# Patient Record
Sex: Male | Born: 1955 | Race: White | Hispanic: No | Marital: Married | Smoking: Current every day smoker
Health system: Southern US, Community
[De-identification: ages and names within clinical notes are randomized; demographics above are authoritative.]

## PROBLEM LIST (undated history)

## (undated) DIAGNOSIS — M549 Dorsalgia, unspecified: Secondary | ICD-10-CM

## (undated) DIAGNOSIS — M1990 Unspecified osteoarthritis, unspecified site: Secondary | ICD-10-CM

## (undated) DIAGNOSIS — J449 Chronic obstructive pulmonary disease, unspecified: Secondary | ICD-10-CM

---

## 2014-07-25 ENCOUNTER — Other Ambulatory Visit (HOSPITAL_COMMUNITY): Payer: Self-pay | Admitting: Family Medicine

## 2014-07-25 ENCOUNTER — Ambulatory Visit (HOSPITAL_COMMUNITY)
Admission: RE | Admit: 2014-07-25 | Discharge: 2014-07-25 | Disposition: A | Discharge status: Home / Self Care | Payer: BLUE CROSS/BLUE SHIELD | Source: Ambulatory Visit | Attending: Family Medicine | Admitting: Family Medicine

## 2014-07-25 DIAGNOSIS — J449 Chronic obstructive pulmonary disease, unspecified: Secondary | ICD-10-CM | POA: Insufficient documentation

## 2014-07-25 DIAGNOSIS — J439 Emphysema, unspecified: Secondary | ICD-10-CM

## 2014-07-25 DIAGNOSIS — R0602 Shortness of breath: Secondary | ICD-10-CM | POA: Insufficient documentation

## 2014-10-02 ENCOUNTER — Encounter (HOSPITAL_COMMUNITY): Payer: Self-pay

## 2014-10-02 ENCOUNTER — Emergency Department (HOSPITAL_COMMUNITY): Payer: BLUE CROSS/BLUE SHIELD

## 2014-10-02 ENCOUNTER — Inpatient Hospital Stay (HOSPITAL_COMMUNITY)
Admission: EM | Admit: 2014-10-02 | Discharge: 2014-10-04 | DRG: 190 | Disposition: A | Discharge status: Home / Self Care | Payer: BLUE CROSS/BLUE SHIELD | Attending: Internal Medicine | Admitting: Internal Medicine

## 2014-10-02 DIAGNOSIS — F419 Anxiety disorder, unspecified: Secondary | ICD-10-CM | POA: Diagnosis present

## 2014-10-02 DIAGNOSIS — Z87891 Personal history of nicotine dependence: Secondary | ICD-10-CM | POA: Diagnosis not present

## 2014-10-02 DIAGNOSIS — E871 Hypo-osmolality and hyponatremia: Secondary | ICD-10-CM | POA: Diagnosis present

## 2014-10-02 DIAGNOSIS — M1990 Unspecified osteoarthritis, unspecified site: Secondary | ICD-10-CM | POA: Diagnosis present

## 2014-10-02 DIAGNOSIS — R739 Hyperglycemia, unspecified: Secondary | ICD-10-CM | POA: Diagnosis present

## 2014-10-02 DIAGNOSIS — J9601 Acute respiratory failure with hypoxia: Secondary | ICD-10-CM | POA: Diagnosis not present

## 2014-10-02 DIAGNOSIS — J9600 Acute respiratory failure, unspecified whether with hypoxia or hypercapnia: Secondary | ICD-10-CM | POA: Diagnosis not present

## 2014-10-02 DIAGNOSIS — T380X5A Adverse effect of glucocorticoids and synthetic analogues, initial encounter: Secondary | ICD-10-CM | POA: Diagnosis present

## 2014-10-02 DIAGNOSIS — R0602 Shortness of breath: Secondary | ICD-10-CM | POA: Diagnosis present

## 2014-10-02 DIAGNOSIS — D696 Thrombocytopenia, unspecified: Secondary | ICD-10-CM | POA: Diagnosis present

## 2014-10-02 DIAGNOSIS — J441 Chronic obstructive pulmonary disease with (acute) exacerbation: Principal | ICD-10-CM | POA: Diagnosis present

## 2014-10-02 DIAGNOSIS — J9622 Acute and chronic respiratory failure with hypercapnia: Secondary | ICD-10-CM | POA: Insufficient documentation

## 2014-10-02 DIAGNOSIS — R000 Tachycardia, unspecified: Secondary | ICD-10-CM | POA: Diagnosis present

## 2014-10-02 HISTORY — DX: Dorsalgia, unspecified: M54.9

## 2014-10-02 HISTORY — DX: Unspecified osteoarthritis, unspecified site: M19.90

## 2014-10-02 HISTORY — DX: Chronic obstructive pulmonary disease, unspecified: J44.9

## 2014-10-02 LAB — BLOOD GAS, ARTERIAL
ACID-BASE EXCESS: 0 mmol/L (ref 0.0–2.0)
Bicarbonate: 25 mEq/L — ABNORMAL HIGH (ref 20.0–24.0)
DRAWN BY: 27407
Delivery systems: POSITIVE
EXPIRATORY PAP: 6
FIO2: 0.4
INSPIRATORY PAP: 14
O2 Saturation: 99.5 %
PO2 ART: 206 mmHg — AB (ref 80.0–100.0)
TCO2: 19.8 mmol/L (ref 0–100)
pCO2 arterial: 33.4 mmHg — ABNORMAL LOW (ref 35.0–45.0)
pH, Arterial: 7.46 — ABNORMAL HIGH (ref 7.350–7.450)

## 2014-10-02 LAB — COMPREHENSIVE METABOLIC PANEL
ALBUMIN: 4.7 g/dL (ref 3.5–5.0)
ALK PHOS: 98 U/L (ref 38–126)
ALT: 25 U/L (ref 17–63)
ANION GAP: 10 (ref 5–15)
AST: 36 U/L (ref 15–41)
BUN: 25 mg/dL — ABNORMAL HIGH (ref 6–20)
CALCIUM: 9.3 mg/dL (ref 8.9–10.3)
CHLORIDE: 94 mmol/L — AB (ref 101–111)
CO2: 29 mmol/L (ref 22–32)
CREATININE: 0.87 mg/dL (ref 0.61–1.24)
GFR calc non Af Amer: >60 mL/min (ref >60)
GLUCOSE: 122 mg/dL — AB (ref 65–99)
Potassium: 4.2 mmol/L (ref 3.5–5.1)
SODIUM: 133 mmol/L — AB (ref 135–145)
Total Bilirubin: 1.7 mg/dL — ABNORMAL HIGH (ref 0.3–1.2)
Total Protein: 8.8 g/dL — ABNORMAL HIGH (ref 6.5–8.1)

## 2014-10-02 LAB — URINALYSIS, ROUTINE W REFLEX MICROSCOPIC
GLUCOSE, UA: NEGATIVE mg/dL
Leukocytes, UA: NEGATIVE
Nitrite: NEGATIVE
PROTEIN: 30 mg/dL — AB
Specific Gravity, Urine: >1.03 — ABNORMAL HIGH (ref 1.005–1.030)
UROBILINOGEN UA: 1 mg/dL (ref 0.0–1.0)
pH: 5.5 (ref 5.0–8.0)

## 2014-10-02 LAB — CBC WITH DIFFERENTIAL/PLATELET
BASOS PCT: 0 % (ref 0–1)
Basophils Absolute: 0 10*3/uL (ref 0.0–0.1)
Eosinophils Absolute: 0 10*3/uL (ref 0.0–0.7)
Eosinophils Relative: 0 % (ref 0–5)
HEMATOCRIT: 45.7 % (ref 39.0–52.0)
HEMOGLOBIN: 16.3 g/dL (ref 13.0–17.0)
LYMPHS ABS: 0.8 10*3/uL (ref 0.7–4.0)
Lymphocytes Relative: 14 % (ref 12–46)
MCH: 31.1 pg (ref 26.0–34.0)
MCHC: 35.7 g/dL (ref 30.0–36.0)
MCV: 87.2 fL (ref 78.0–100.0)
MONO ABS: 0.6 10*3/uL (ref 0.1–1.0)
MONOS PCT: 10 % (ref 3–12)
NEUTROS ABS: 4.4 10*3/uL (ref 1.7–7.7)
NEUTROS PCT: 76 % (ref 43–77)
Platelets: 139 10*3/uL — ABNORMAL LOW (ref 150–400)
RBC: 5.24 MIL/uL (ref 4.22–5.81)
RDW: 14 % (ref 11.5–15.5)
WBC: 5.9 10*3/uL (ref 4.0–10.5)

## 2014-10-02 LAB — I-STAT CG4 LACTIC ACID, ED: LACTIC ACID, VENOUS: 1.48 mmol/L (ref 0.5–2.0)

## 2014-10-02 LAB — BRAIN NATRIURETIC PEPTIDE: B NATRIURETIC PEPTIDE 5: 58 pg/mL (ref 0.0–100.0)

## 2014-10-02 LAB — URINE MICROSCOPIC-ADD ON

## 2014-10-02 LAB — TROPONIN I: Troponin I: 0.03 ng/mL (ref <0.031)

## 2014-10-02 LAB — TSH: TSH: 1.051 u[IU]/mL (ref 0.350–4.500)

## 2014-10-02 LAB — MRSA PCR SCREENING: MRSA by PCR: NEGATIVE

## 2014-10-02 MED ORDER — METHYLPREDNISOLONE SODIUM SUCC 125 MG IJ SOLR
125.0000 mg | Freq: Once | INTRAMUSCULAR | Status: AC
  Administered 2014-10-02: 125 mg via INTRAVENOUS
  Filled 2014-10-02: qty 2

## 2014-10-02 MED ORDER — ONDANSETRON HCL 4 MG PO TABS: 4.0000 mg | ORAL_TABLET | Freq: Four times a day (QID) | ORAL | Status: DC | PRN

## 2014-10-02 MED ORDER — SENNOSIDES-DOCUSATE SODIUM 8.6-50 MG PO TABS
1.0000 | ORAL_TABLET | Freq: Every evening | ORAL | Status: DC | PRN
  Administered 2014-10-03: 1 via ORAL
  Filled 2014-10-02: qty 1

## 2014-10-02 MED ORDER — HYDROCODONE-ACETAMINOPHEN 5-325 MG PO TABS: 1.0000 | ORAL_TABLET | ORAL | Status: DC | PRN

## 2014-10-02 MED ORDER — LEVOFLOXACIN IN D5W 750 MG/150ML IV SOLN: 750.0000 mg | INTRAVENOUS | Status: DC

## 2014-10-02 MED ORDER — SODIUM CHLORIDE 0.9 % IJ SOLN
3.0000 mL | Freq: Two times a day (BID) | INTRAMUSCULAR | Status: DC
  Administered 2014-10-02 – 2014-10-04 (×5): 3 mL via INTRAVENOUS

## 2014-10-02 MED ORDER — IPRATROPIUM-ALBUTEROL 0.5-2.5 (3) MG/3ML IN SOLN
3.0000 mL | Freq: Once | RESPIRATORY_TRACT | Status: AC
  Administered 2014-10-02: 3 mL via RESPIRATORY_TRACT
  Filled 2014-10-02: qty 3

## 2014-10-02 MED ORDER — ACETAMINOPHEN 325 MG PO TABS: 650.0000 mg | ORAL_TABLET | Freq: Four times a day (QID) | ORAL | Status: DC | PRN

## 2014-10-02 MED ORDER — MAGNESIUM SULFATE IN D5W 10-5 MG/ML-% IV SOLN
1.0000 g | Freq: Once | INTRAVENOUS | Status: AC
  Administered 2014-10-02: 1 g via INTRAVENOUS
  Filled 2014-10-02: qty 100

## 2014-10-02 MED ORDER — ALUM & MAG HYDROXIDE-SIMETH 200-200-20 MG/5ML PO SUSP
30.0000 mL | Freq: Four times a day (QID) | ORAL | Status: DC | PRN
Start: 2014-10-02 — End: 2014-10-04

## 2014-10-02 MED ORDER — ENOXAPARIN SODIUM 30 MG/0.3ML ~~LOC~~ SOLN
30.0000 mg | SUBCUTANEOUS | Status: DC
  Administered 2014-10-02 – 2014-10-03 (×2): 30 mg via SUBCUTANEOUS
  Filled 2014-10-02 (×2): qty 0.3

## 2014-10-02 MED ORDER — LEVOFLOXACIN IN D5W 750 MG/150ML IV SOLN
750.0000 mg | INTRAVENOUS | Status: DC
  Filled 2014-10-02: qty 150

## 2014-10-02 MED ORDER — SORBITOL 70 % SOLN: 30.0000 mL | Freq: Every day | Status: DC | PRN

## 2014-10-02 MED ORDER — LEVOFLOXACIN IN D5W 500 MG/100ML IV SOLN
500.0000 mg | Freq: Once | INTRAVENOUS | Status: AC
  Administered 2014-10-02: 500 mg via INTRAVENOUS
  Filled 2014-10-02: qty 100

## 2014-10-02 MED ORDER — SODIUM CHLORIDE 0.9 % IV SOLN
INTRAVENOUS | Status: AC
  Administered 2014-10-02: 12:00:00 via INTRAVENOUS

## 2014-10-02 MED ORDER — ACETAMINOPHEN 650 MG RE SUPP
650.0000 mg | Freq: Four times a day (QID) | RECTAL | Status: DC | PRN
Start: 2014-10-02 — End: 2014-10-04

## 2014-10-02 MED ORDER — GUAIFENESIN-DM 100-10 MG/5ML PO SYRP: 5.0000 mL | ORAL_SOLUTION | ORAL | Status: DC | PRN

## 2014-10-02 MED ORDER — TIOTROPIUM BROMIDE MONOHYDRATE 18 MCG IN CAPS: 18.0000 ug | ORAL_CAPSULE | Freq: Every day | RESPIRATORY_TRACT | Status: DC

## 2014-10-02 MED ORDER — ONDANSETRON HCL 4 MG/2ML IJ SOLN: 4.0000 mg | Freq: Four times a day (QID) | INTRAMUSCULAR | Status: DC | PRN

## 2014-10-02 MED ORDER — METHYLPREDNISOLONE SODIUM SUCC 125 MG IJ SOLR
60.0000 mg | Freq: Four times a day (QID) | INTRAMUSCULAR | Status: DC
  Administered 2014-10-02 – 2014-10-04 (×8): 60 mg via INTRAVENOUS
  Filled 2014-10-02 (×9): qty 2

## 2014-10-02 MED ORDER — IPRATROPIUM-ALBUTEROL 0.5-2.5 (3) MG/3ML IN SOLN
3.0000 mL | RESPIRATORY_TRACT | Status: DC
  Administered 2014-10-02 – 2014-10-03 (×9): 3 mL via RESPIRATORY_TRACT
  Filled 2014-10-02 (×9): qty 3

## 2014-10-02 NOTE — ED Provider Notes (Signed)
MSE was initiated and I personally evaluated the patient and placed orders (if any) at  7:22 AM on October 02, 2014.  The patient appears stable so that the remainder of the MSE may be completed by another provider.  Patient arrived in respiratory distress. History of COPD. Reports 2 days of increasing shortness of breath, low-grade temperatures around 100, cough productive of yellow sputum. Has been using his inhalers at home with no relief. States he has never felt this bad. On EMS arrival had O2 sats in the low 90s. Received a DuoNeb en route. Denies any chest pain. No history of heart failure.  On exam, patient is tripoding and using accessory muscles. Poor air movement. Given presentation, will immediately start patient on BiPAP.  Sepsis workup initiated given history of fevers and cough. Will hold antibiotics and fluids until initial vital signs obtained. Patient given Solu-Medrol and magnesium as well as a duo neb. Discussed patient presentation of oncoming provider, Dr. Patria Mane.  Shon Baton, MD 10/02/14 [phone]

## 2014-10-02 NOTE — Progress Notes (Signed)
Pt transported from ER to ICU on Bipap.  Pt tolerating well at this time.  RT will continue to monitor.

## 2014-10-02 NOTE — ED Notes (Signed)
Pt not able to void at this time, urinal at bedside.

## 2014-10-02 NOTE — ED Notes (Signed)
Note sent to Pharmacy for Magnesium.

## 2014-10-02 NOTE — ED Notes (Signed)
EMS reports pt has copd and has had sob x 2 days.  EMS administered albuterol treatment and is receiving combivent at this time.  Also reports fever and productive cough will yellow sputum.

## 2014-10-02 NOTE — Progress Notes (Signed)
Pt setup on Bipap per MD order.  Tolerating well at this time.  RT will continue to monitor.

## 2014-10-02 NOTE — H&P (Signed)
[facility] History and Physical  Roger Arnold ZOX:[id number] DOB: January 04, 1956 DOA: 10/02/2014  Referring physician: Patria Mane PCP: Pershing Proud   Chief Complaint: sob  HPI: Roger Arnold is a 59 y.o. male past medical hx copd not on oxygen and recently put on inhaler by PCP presents to ED with persistent worsening sob. Initial evaluation in the emergency department revealed acute respiratory failure and patient was placed on BiPAP. Information is obtained from the wife who is at the bedside. She reports gradual worsening shortness of breath over the last several weeks. He was seen by primary care provider 2 months ago and placed on long-term and rescue inhalers. She states "I feel he uses the rescue inhaler way too much". 2 days ago his respiratory effort began to gradually worsen. Associated symptoms include nonproductive cough and fever 2 episodes of diarrhea. He denies chest pain palpitations headache dizziness syncope or near-syncope. He denies lower extremity edema orthopnea. He denies abdominal pain nausea vomiting dysuria hematuria frequency or urgency. Wife reports over the last 2 days he's been using his inhalers but his respiratory status continued to worsen. During this time. Patient refused to go to the hospital. This morning the wife finally insisted.  Workup in the emergency department reveals sodium 133, platelets 139, glucose 122. Chest x-ray no active cardiopulmonary disease. Changes of COPD stable. EKG ectopic atrial tachycardia, unifocal ventricular premature complex  While in the emergency department he is provided with Solu-Medrol and Levaquin broncho-dilator 2. Respiratory rate 30 and he was placed on BiPAP. He gradually improved and was referred for admission.  Review of Systems:  10 point review of systems complete and all systems are negative except as indicated in the history of present illness  Past Medical History  Diagnosis Date  . COPD (chronic  obstructive pulmonary disease)   . Arthritis   . Back pain    History reviewed. No pertinent past surgical history. Social History:  reports that he has quit smoking. He does not have any smokeless tobacco history on file. He reports that he does not drink alcohol or use illicit drugs. 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 He quit smoking 6 years ago. He lives at home with his wife he is independent with ADLs. Allergies  Allergen Reactions  . Percocet [Oxycodone-Acetaminophen] Rash and Other (See Comments)    Has cold sweats    No family history on file. father deceased of brain tumor mother is alive history of heart disease cancer stroke. Recently diagnosed with COPD  Prior to Admission medications   Medication Sig Start Date End Date Taking? Authorizing Provider  PROAIR HFA 108 (90 BASE) MCG/ACT inhaler Inhale 2 puffs into the lungs every 6 (six) hours as needed. Shortness of breath,cough(rescue inhaler) 08/31/14   Historical Provider, MD  SPIRIVA RESPIMAT 2.5 MCG/ACT AERS Inhale 2 puffs into the lungs daily. 09/01/14   Historical Provider, MD   Physical Exam: Filed Vitals:   10/02/14 0815 10/02/14 0838 10/02/14 0900 10/02/14 0906  BP:  132/78 123/79   Pulse: 113 108 103   Temp:      TempSrc:      Resp: 19 28 27    Height:      Weight:      SpO2: 99% 100% 100% 100%    Wt Readings from Last 3 Encounters:  10/02/14 68.04 kg (150 lb)    General:  Appears calm and somewhat comfortable Eyes: PERRL, normal lids, irises & conjunctiva ENT: grossly  normal hearing, lips & tongue Neck: no LAD, masses or thyromegaly Cardiovascular: Tachycardic but regular, no m/r/g. No LE edema.  Respiratory: Mild increased work of breathing on BiPAP with conversation. Breath sounds quite diminished throughout. Faint wheezing. Abdomen: soft, ntnd positive bowel sounds Skin:  Musculoskeletal: grossly normal tone BUE/BLE Psychiatric: grossly normal mood and affect,  speech fluent and appropriate Neurologic: grossly non-focal.          Labs on Admission:  Basic Metabolic Panel:  Recent Labs Lab 10/02/14 0722  NA 133*  K 4.2  CL 94*  CO2 29  GLUCOSE 122*  BUN 25*  CREATININE 0.87  CALCIUM 9.3   Liver Function Tests:  Recent Labs Lab 10/02/14 0722  AST 36  ALT 25  ALKPHOS 98  BILITOT 1.7*  PROT 8.8*  ALBUMIN 4.7   No results for input(s): LIPASE, AMYLASE in the last 168 hours. No results for input(s): AMMONIA in the last 168 hours. CBC:  Recent Labs Lab 10/02/14 0722  WBC 5.9  NEUTROABS 4.4  HGB 16.3  HCT 45.7  MCV 87.2  PLT 139*   Cardiac Enzymes:  Recent Labs Lab 10/02/14 0722  TROPONINI 0.03    BNP (last 3 results)  Recent Labs  10/02/14 0723  BNP 58.0    ProBNP (last 3 results) No results for input(s): PROBNP in the last 8760 hours.  CBG: No results for input(s): GLUCAP in the last 168 hours.  Radiological Exams on Admission: Dg Chest Port 1 View  10/02/2014   CLINICAL DATA:  Productive cough, fever, shortness of breath for 2 days.  EXAM: PORTABLE CHEST - 1 VIEW  COMPARISON:  July 25, 2014  FINDINGS: The heart size and mediastinal contours are stable. The aorta is tortuous. There is questioned dilatation of the ascending in the aorta versus tortuosity of the aorta unchanged. The lungs are hyperinflated. There is no focal infiltrate, pulmonary edema, or pleural effusion. The visualized skeletal structures are stable.  IMPRESSION: No active cardiopulmonary disease.  Changes of COPD stable.   Electronically Signed   By: Sherian Rein M.D.   On: 10/02/2014 08:11    EKG: Independently reviewed. See above  Assessment/Plan Principal Problem:   Acute respiratory failure: Likely related to COPD exacerbation. Trigger unclear. Will admit to step down and continue BiPAP. Will wean as able. Will continue nebulizers steroids and antibiotic. Chest x-ray without any signs of infiltrate and patient afebrile on  presentation. Currently oxygen saturation level 100% on BiPAP. He is much more comfortable. Monitor closely Active Problems:   COPD exacerbation: Trigger unclear. Patient seen by [place] PCP 2 months ago for gradual worsening shortness of breath. At that time he was given spiriva and pro air. Wife reports "needing the rescue inhaler way too much". Will likely benefit from pulmonary function test on outpatient basis. Not currently on outpatient oxygen. Will evaluate at discharge.    Tachycardia: Likely related to above. Admit to step down. No chest pain. Initial troponin negative. Will monitor    Hyponatremia: Mild. Likely related to decreased oral intake over the last couple of days. Will provide gentle IV fluids. Will recheck in the morning    Thrombocytopenia: Mild. Will monitor. No signs of bleeding.   Code Status: full DVT Prophylaxis: Family Communication: Wife and daughter at bedside Disposition Plan: home when ready  Time spent: 75 minutes  [facility] Pager [phone]

## 2014-10-02 NOTE — ED Notes (Addendum)
Feet mottled and left  Hand, wife says they have been mottled for greater than 6 months.  Had labored breathing since Friday.

## 2014-10-02 NOTE — ED Notes (Signed)
Resp. Called for transport.

## 2014-10-02 NOTE — Progress Notes (Signed)
ANTIBIOTIC CONSULT NOTE - INITIAL  Pharmacy Consult for Levaquin Indication: COPD exacerbation  Allergies  Allergen Reactions  . Percocet [Oxycodone-Acetaminophen] Rash and Other (See Comments)    Has cold sweats    Patient Measurements: Height:  (175.3 cm) Weight: 150 lb (68.04 kg) IBW/kg (Calculated) : 70.7  Vital Signs: Temp: 100 F (37.8 C) (09/12 0722) Temp Source: Oral (09/12 0722) BP: 126/74 mmHg (09/12 1000) Pulse Rate: 101 (09/12 1000) Intake/Output from previous day:   Intake/Output from this shift:    Labs:  Recent Labs  10/02/14 0722  WBC 5.9  HGB 16.3  PLT 139*  CREATININE 0.87   Estimated Creatinine Clearance: 85.8 mL/min (by C-G formula based on Cr of 0.87). No results for input(s): VANCOTROUGH, VANCOPEAK, VANCORANDOM, GENTTROUGH, GENTPEAK, GENTRANDOM, TOBRATROUGH, TOBRAPEAK, TOBRARND, AMIKACINPEAK, AMIKACINTROU, AMIKACIN in the last 72 hours.   Microbiology: Recent Results (from the past 720 hour(s))  Blood Culture (routine x 2)     Status: None (Preliminary result)   Collection Time: 10/02/14  7:45 AM  Result Value Ref Range Status   Specimen Description BLOOD  Final   Special Requests NONE  Final   Culture NO GROWTH < 12 HOURS  Final   Report Status PENDING  Incomplete  Blood Culture (routine x 2)     Status: None (Preliminary result)   Collection Time: 10/02/14  7:50 AM  Result Value Ref Range Status   Specimen Description BLOOD  Final   Special Requests NONE  Final   Culture NO GROWTH < 12 HOURS  Final   Report Status PENDING  Incomplete    Medical History: Past Medical History  Diagnosis Date  . COPD (chronic obstructive pulmonary disease)   . Arthritis   . Back pain    Anti-infectives    Start     Dose/Rate Route Frequency Ordered Stop   10/02/14 1200  levofloxacin (LEVAQUIN) IVPB 750 mg     750 mg 100 mL/hr over 90 Minutes Intravenous Every 24 hours 10/02/14 1147 10/09/14 1159   10/02/14 0900  levofloxacin (LEVAQUIN)  IVPB 500 mg     500 mg 100 mL/hr over 60 Minutes Intravenous  Once 10/02/14 0854 10/02/14 1028     Assessment: 59yo male with h/o COPD presents with persistent worsening SOB.  Asked to initiate Levaquin, pt received initial dose in ED.  Estimated Creatinine Clearance: 85.8 mL/min (by C-G formula based on Cr of 0.87).  Goal of Therapy:  Eradicate infection.  Plan:  Levaquin  IV q24hrs starting tomorrow Switch to PO when improved / appropriate Duration of therapy per MD, anticipate 5-7 days total Monitor labs, progress, and c/s  Margo Aye, [place] Zarazua A 10/02/2014,11:53 AM

## 2014-10-02 NOTE — ED Provider Notes (Signed)
CSN: [id number]     Arrival date & time 10/02/14  2956 History   First MD Initiated Contact with Patient 10/02/14 [id number]     Chief Complaint  Patient presents with  . Shortness of Breath      The history is provided by the patient and the EMS personnel.   patient presents to the emergency department with increasing shortness of breath over the past 2 days.  He has a history of COPD and is not oxygen dependent.  He's been trying his inhalers without improvement in his symptoms.  His wife is been begging him to come the emergency department for evaluation but he would not.  His breathing finally became worse this morning to the point where EMS was called.  Albuterol and Atrovent were given in route and the patient was placed on BiPAP on arrival to the emergency department for significant respiratory distress.  Prior to my evaluation the off going physician provided Solu-Medrol as well as magnesium.  On my arrival we are placing the patient on BiPAP.  Patient and family report fever through the weekend with productive cough.  Denies anterior chest pain.  No history of congestive heart failure.  Denies unilateral leg swelling.  No history DVT or pulmonary embolism.  Reports diarrhea without nausea vomiting  Past Medical History  Diagnosis Date  . COPD (chronic obstructive pulmonary disease)   . Arthritis   . Back pain    History reviewed. No pertinent past surgical history. No family history on file. Social History  Substance Use Topics  . Smoking status: Former Games developer  . Smokeless tobacco: None  . Alcohol Use: No    Review of Systems  All other systems reviewed and are negative.     Allergies  Percocet  Home Medications   Prior to Admission medications   Not on File   BP 132/78 mmHg  Pulse 108  Temp(Src) 100 F (37.8 C) (Oral)  Resp 28  Ht  (1.753 m)  Wt 150 lb (68.04 kg)  BMI 22.14 kg/m2  SpO2 100% Physical Exam  Constitutional: He is oriented to person, place, and  time. He appears well-developed and well-nourished.  HENT:  Head: Normocephalic and atraumatic.  Eyes: EOM are normal.  Neck: Normal range of motion.  Cardiovascular: Regular rhythm, normal heart sounds and intact distal pulses.   tachycardia  Pulmonary/Chest: He is in respiratory distress. He has wheezes.  Accessory muscle use  Abdominal: Soft. He exhibits no distension. There is no tenderness.  Musculoskeletal: Normal range of motion.  Neurological: He is alert and oriented to person, place, and time.  Skin: Skin is warm and dry.  Psychiatric:  Anxious appearing  Nursing note and vitals reviewed.   ED Course  Procedures (including critical care time)  CRITICAL CARE Performed by: Lyanne Co Total critical care time: 32 Critical care time was exclusive of separately billable procedures and treating other patients. Critical care was necessary to treat or prevent imminent or life-threatening deterioration. Critical care was time spent personally by me on the following activities: development of treatment plan with patient and/or surrogate as well as nursing, discussions with consultants, evaluation of patient's response to treatment, examination of patient, obtaining history from patient or surrogate, ordering and performing treatments and interventions, ordering and review of laboratory studies, ordering and review of radiographic studies, pulse oximetry and re-evaluation of patient's condition.   Labs Review Labs Reviewed  COMPREHENSIVE METABOLIC PANEL - Abnormal; Notable for the following:    Sodium 133 (*)  Chloride 94 (*)    Glucose, Bld 122 (*)    BUN 25 (*)    Total Protein 8.8 (*)    Total Bilirubin 1.7 (*)    All other components within normal limits  CBC WITH DIFFERENTIAL/PLATELET - Abnormal; Notable for the following:    Platelets 139 (*)    All other components within normal limits  URINALYSIS, ROUTINE W REFLEX MICROSCOPIC (NOT AT [facility]) - Abnormal; Notable  for the following:    Specific Gravity, Urine >1.030 (*)    Hgb urine dipstick LARGE (*)    Bilirubin Urine SMALL (*)    Ketones, ur TRACE (*)    Protein, ur 30 (*)    All other components within normal limits  BLOOD GAS, ARTERIAL - Abnormal; Notable for the following:    pH, Arterial 7.460 (*)    pCO2 arterial 33.4 (*)    pO2, Arterial 206 (*)    Bicarbonate 25.0 (*)    All other components within normal limits  URINE MICROSCOPIC-ADD ON - Abnormal; Notable for the following:    Bacteria, UA MANY (*)    All other components within normal limits  CULTURE, BLOOD (ROUTINE X 2)  CULTURE, BLOOD (ROUTINE X 2)  URINE CULTURE  BRAIN NATRIURETIC PEPTIDE  TROPONIN I  I-STAT CG4 LACTIC ACID, ED    Imaging Review Dg Chest Port 1 View  10/02/2014   CLINICAL DATA:  Productive cough, fever, shortness of breath for 2 days.  EXAM: PORTABLE CHEST - 1 VIEW  COMPARISON:  July 25, 2014  FINDINGS: The heart size and mediastinal contours are stable. The aorta is tortuous. There is questioned dilatation of the ascending in the aorta versus tortuosity of the aorta unchanged. The lungs are hyperinflated. There is no focal infiltrate, pulmonary edema, or pleural effusion. The visualized skeletal structures are stable.  IMPRESSION: No active cardiopulmonary disease.  Changes of COPD stable.   Electronically Signed   By: Sherian Rein M.D.   On: 10/02/2014 08:11   I have personally reviewed and evaluated these images and lab results as part of my medical decision-making.   EKG Interpretation None      MDM   Final diagnoses:  Acute respiratory failure, unspecified whether with hypoxia or hypercapnia  COPD exacerbation    Patient appears to be improving somewhat on maximal therapy.  At this time he is tolerating BiPAP well and I suspect that over the next several hours she'll BLD be titrated off BiPAP.  Will put additional bronchodilators on this time.  Add IV Levaquin for severe COPD exacerbation.  No  obvious infiltrate noted on chest x-ray.  Labs unremarkable.  Patient will be admitted to ICU stepdown.  Will speak with hospitalist.    Azalia Bilis, MD 10/02/14 [phone]

## 2014-10-02 NOTE — ED Notes (Signed)
Called Pharmacy for Magnesium.  Too bring on rounds.

## 2014-10-03 DIAGNOSIS — J9622 Acute and chronic respiratory failure with hypercapnia: Secondary | ICD-10-CM | POA: Insufficient documentation

## 2014-10-03 DIAGNOSIS — R739 Hyperglycemia, unspecified: Secondary | ICD-10-CM | POA: Diagnosis present

## 2014-10-03 DIAGNOSIS — J9600 Acute respiratory failure, unspecified whether with hypoxia or hypercapnia: Secondary | ICD-10-CM

## 2014-10-03 LAB — CBC
HEMATOCRIT: 37.8 % — AB (ref 39.0–52.0)
Hemoglobin: 13.3 g/dL (ref 13.0–17.0)
MCH: 30.4 pg (ref 26.0–34.0)
MCHC: 35.2 g/dL (ref 30.0–36.0)
MCV: 86.3 fL (ref 78.0–100.0)
PLATELETS: 140 10*3/uL — AB (ref 150–400)
RBC: 4.38 MIL/uL (ref 4.22–5.81)
RDW: 13.7 % (ref 11.5–15.5)
WBC: 6.3 10*3/uL (ref 4.0–10.5)

## 2014-10-03 LAB — COMPREHENSIVE METABOLIC PANEL
ALT: 19 U/L (ref 17–63)
AST: 29 U/L (ref 15–41)
Albumin: 3.6 g/dL (ref 3.5–5.0)
Alkaline Phosphatase: 78 U/L (ref 38–126)
Anion gap: 9 (ref 5–15)
BILIRUBIN TOTAL: 0.7 mg/dL (ref 0.3–1.2)
BUN: 18 mg/dL (ref 6–20)
CHLORIDE: 101 mmol/L (ref 101–111)
CO2: 26 mmol/L (ref 22–32)
CREATININE: 0.86 mg/dL (ref 0.61–1.24)
Calcium: 8.5 mg/dL — ABNORMAL LOW (ref 8.9–10.3)
Glucose, Bld: 179 mg/dL — ABNORMAL HIGH (ref 65–99)
POTASSIUM: 3.7 mmol/L (ref 3.5–5.1)
Sodium: 136 mmol/L (ref 135–145)
TOTAL PROTEIN: 6.8 g/dL (ref 6.5–8.1)

## 2014-10-03 LAB — GLUCOSE, CAPILLARY
GLUCOSE-CAPILLARY: 134 mg/dL — AB (ref 65–99)
GLUCOSE-CAPILLARY: 231 mg/dL — AB (ref 65–99)
Glucose-Capillary: 179 mg/dL — ABNORMAL HIGH (ref 65–99)

## 2014-10-03 MED ORDER — IPRATROPIUM-ALBUTEROL 0.5-2.5 (3) MG/3ML IN SOLN
3.0000 mL | Freq: Four times a day (QID) | RESPIRATORY_TRACT | Status: DC
  Administered 2014-10-04: 3 mL via RESPIRATORY_TRACT
  Filled 2014-10-03: qty 3

## 2014-10-03 MED ORDER — ALBUTEROL SULFATE (2.5 MG/3ML) 0.083% IN NEBU: 2.5000 mg | INHALATION_SOLUTION | RESPIRATORY_TRACT | Status: DC | PRN

## 2014-10-03 MED ORDER — ALPRAZOLAM 0.25 MG PO TABS
0.2500 mg | ORAL_TABLET | Freq: Two times a day (BID) | ORAL | Status: DC
  Administered 2014-10-03 – 2014-10-04 (×3): 0.25 mg via ORAL
  Filled 2014-10-03 (×3): qty 1

## 2014-10-03 MED ORDER — LEVOFLOXACIN 750 MG PO TABS
750.0000 mg | ORAL_TABLET | Freq: Every day | ORAL | Status: DC
  Administered 2014-10-03 – 2014-10-04 (×2): 750 mg via ORAL
  Filled 2014-10-03 (×2): qty 1

## 2014-10-03 MED ORDER — INSULIN ASPART 100 UNIT/ML ~~LOC~~ SOLN
0.0000 [IU] | Freq: Three times a day (TID) | SUBCUTANEOUS | Status: DC
  Administered 2014-10-03: 1 [IU] via SUBCUTANEOUS
  Administered 2014-10-03: 2 [IU] via SUBCUTANEOUS
  Administered 2014-10-04: 3 [IU] via SUBCUTANEOUS

## 2014-10-03 NOTE — Progress Notes (Signed)
Patient wanted to ambulate in hall.  Pt's O2 sats on RA resting 88%, Pt's O2 sats on RA ambulating 86%.  Patient placed on 2L O2 and O2 sats resting 95%.

## 2014-10-03 NOTE — Progress Notes (Signed)
[facility] PROGRESS NOTE  Roger Arnold [id number]:[id number] DOB: [birth year] DOA: 10/02/2014 PCP: Pershing Proud  Assessment/Plan:  Acute respiratory failure: Likely related to COPD exacerbation. Improved this am. Oxygen saturation level >90% on 3L [place].  Will continue nebulizers steroids and antibiotic. Will change antibiotics to oral. Will add flutter valve.  Will transfer to medical floor and mobilize.  Active Problems:  COPD exacerbation: Patient reports current respiratory effort "close to normal for him". Reports his breathing has been bad "for a long time". Just recently (July) established with PCP and inhalers provided.  Will likely benefit from pulmonary function test on outpatient basis. Not currently on outpatient oxygen. Will evaluate at discharge.  Hyperglycemia: likely steroid induced. Will keep solumedrol dose the same for today. Plan to start taper tomorrow. Will monitor CBG.    Tachycardia: mild. Likely related to nebs.  No chest pain. Troponin negative x2. Will monitor  Anxiety: reports restlessness and inability to sleep. Anxiety over sob. Will provide low dose xanax. monitor   Hyponatremia: Mild. resolved   Thrombocytopenia: Mild. Trending up.  Will monitor. No signs of bleeding. OP follow up.   Code Status: full Family Communication: none present Disposition Plan: home hopefully 24-48 hours   Consultants:  none  Procedures:  none  Antibiotics:  levaquin 10/02/14>>>>>  HPI/Subjective: Sitting up in bed eating breakfast. Reports breathing "better than yesterday".  Objective: Filed Vitals:   10/03/14 0731  BP:   Pulse:   Temp: 97.7 F (36.5 C)  Resp:     Intake/Output Summary (Last 24 hours) at 10/03/14 0828 Last data filed at 10/03/14 0400  Gross per 24 hour  Intake    748 ml  Output   1000 ml  Net   -252 ml   Filed Weights   10/02/14 0754 10/03/14 0500  Weight: 68.04 kg (150 lb) 68.04 kg (150 lb)    Exam:   General:  Well  nourished appears comfortable somewhat unkept  Cardiovascular: tachycardic but regular no MGR no LE edema  Respiratory: mild increased work of breathing with conversation. Air flow remains quite diminished but less wheeze   Abdomen: non-distended non-tender +BS   Musculoskeletal: no clubbing or cyanosis  Data Reviewed: Basic Metabolic Panel:  Recent Labs Lab 10/02/14 0722 10/03/14 0500  NA 133* 136  K 4.2 3.7  CL 94* 101  CO2 29 26  GLUCOSE 122* 179*  BUN 25* 18  CREATININE 0.87 0.86  CALCIUM 9.3 8.5*   Liver Function Tests:  Recent Labs Lab 10/02/14 0722 10/03/14 0500  AST 36 29  ALT 25 19  ALKPHOS 98 78  BILITOT 1.7* 0.7  PROT 8.8* 6.8  ALBUMIN 4.7 3.6   No results for input(s): LIPASE, AMYLASE in the last 168 hours. No results for input(s): AMMONIA in the last 168 hours. CBC:  Recent Labs Lab 10/02/14 0722 10/03/14 0500  WBC 5.9 6.3  NEUTROABS 4.4  --   HGB 16.3 13.3  HCT 45.7 37.8*  MCV 87.2 86.3  PLT 139* 140*   Cardiac Enzymes:  Recent Labs Lab 10/02/14 0722 10/02/14 1358  TROPONINI 0.03 <0.03   BNP (last 3 results)  Recent Labs  10/02/14 0723  BNP 58.0    ProBNP (last 3 results) No results for input(s): PROBNP in the last 8760 hours.  CBG: No results for input(s): GLUCAP in the last 168 hours.  Recent Results (from the past 240 hour(s))  Blood Culture (routine x 2)     Status: None (Preliminary result)   Collection Time:  10/02/14  7:45 AM  Result Value Ref Range Status   Specimen Description BLOOD  Final   Special Requests NONE  Final   Culture NO GROWTH < 12 HOURS  Final   Report Status PENDING  Incomplete  Blood Culture (routine x 2)     Status: None (Preliminary result)   Collection Time: 10/02/14  7:50 AM  Result Value Ref Range Status   Specimen Description BLOOD  Final   Special Requests NONE  Final   Culture NO GROWTH < 12 HOURS  Final   Report Status PENDING  Incomplete  MRSA PCR Screening     Status: None    Collection Time: 10/02/14  6:30 PM  Result Value Ref Range Status   MRSA by PCR NEGATIVE NEGATIVE Final    Comment:        The GeneXpert MRSA Assay (FDA approved for NASAL specimens only), is one component of a comprehensive MRSA colonization surveillance program. It is not intended to diagnose MRSA infection nor to guide or monitor treatment for MRSA infections.      Studies: Dg Chest Port 1 View  10/02/2014   CLINICAL DATA:  Productive cough, fever, shortness of breath for 2 days.  EXAM: PORTABLE CHEST - 1 VIEW  COMPARISON:  July 25, 2014  FINDINGS: The heart size and mediastinal contours are stable. The aorta is tortuous. There is questioned dilatation of the ascending in the aorta versus tortuosity of the aorta unchanged. The lungs are hyperinflated. There is no focal infiltrate, pulmonary edema, or pleural effusion. The visualized skeletal structures are stable.  IMPRESSION: No active cardiopulmonary disease.  Changes of COPD stable.   Electronically Signed   By: Sherian Rein M.D.   On: 10/02/2014 08:11    Scheduled Meds: . enoxaparin (LOVENOX) injection  30 mg Subcutaneous Q24H  . ipratropium-albuterol  3 mL Nebulization Q4H  . levofloxacin  750 mg Oral Daily  . methylPREDNISolone (SOLU-MEDROL) injection  60 mg Intravenous Q6H  . sodium chloride  3 mL Intravenous Q12H   Continuous Infusions:   Principal Problem:   Acute respiratory failure Active Problems:   COPD exacerbation   Tachycardia   Hyponatremia   Thrombocytopenia   Hyperglycemia    Time spent: 30 minutes    [facility]  [facility] Pager [phone]. If 7PM-7AM, please contact night-coverage at [email or web address], password [facility] 10/03/2014, 8:28 AM  LOS: 1 day

## 2014-10-03 NOTE — Progress Notes (Signed)
Report called to M. Emelda Fear, Charity fundraiser. Patient transferred to Rm 311 in stable condition via wheelchair with NT.

## 2014-10-03 NOTE — Progress Notes (Signed)
Patient only wants to wear CPAP if he starts struggling to breath and needs it. Unit in room on standby. Patient doing well with no distress on 2 lpm nasal cannula at this time.

## 2014-10-04 LAB — GLUCOSE, CAPILLARY
GLUCOSE-CAPILLARY: 242 mg/dL — AB (ref 65–99)
Glucose-Capillary: 120 mg/dL — ABNORMAL HIGH (ref 65–99)

## 2014-10-04 LAB — URINE CULTURE

## 2014-10-04 MED ORDER — LEVOFLOXACIN 750 MG PO TABS: 750.0000 mg | ORAL_TABLET | Freq: Every day | ORAL | Status: DC

## 2014-10-04 MED ORDER — GUAIFENESIN-DM 100-10 MG/5ML PO SYRP: 5.0000 mL | ORAL_SOLUTION | ORAL | Status: DC | PRN

## 2014-10-04 MED ORDER — BUDESONIDE-FORMOTEROL FUMARATE 160-4.5 MCG/ACT IN AERO: 2.0000 | INHALATION_SPRAY | Freq: Two times a day (BID) | RESPIRATORY_TRACT | Status: AC

## 2014-10-04 MED ORDER — PREDNISONE 10 MG PO TABS: ORAL_TABLET | ORAL | Status: DC

## 2014-10-04 NOTE — Discharge Summary (Signed)
Physician Discharge Summary  Roger Arnold ONG:[id number] DOB: [birth year] DOA: 10/02/2014  PCP: Pershing Proud  Admit date: 10/02/2014 Discharge date: 10/04/2014  Time spent: 40 minutes  Recommendations for Outpatient Follow-up:  1. PCP office will contact patient to schedule follow up. Recommend PFT. 2. Continuous oxygen  Discharge Diagnoses:  Principal Problem:   Acute respiratory failure Active Problems:   COPD exacerbation   Tachycardia   Hyponatremia   Thrombocytopenia   Hyperglycemia   Acute respiratory failure, unspecified whether with hypoxia or hypercapnia   Discharge Condition: stable  Diet recommendation: regular  Filed Weights   10/02/14 0754 10/03/14 0500 10/04/14 0545  Weight: 68.04 kg (150 lb) 68.04 kg (150 lb) 69.536 kg (153 lb 4.8 oz)    History of present illness:  Roger Arnold is a 59 y.o. male past medical hx copd not on oxygen and recently put on inhaler by PCP presented to ED on 10/02/14 with persistent worsening sob. Initial evaluation in the emergency department revealed acute respiratory failure and patient was placed on BiPAP. Information obtained from the wife who was at the bedside. She reported gradual worsening shortness of breath over the previous several weeks. He was seen by primary care provider 2 months prior and placed on long-term and rescue inhalers. She stated "I feel he uses the rescue inhaler way too much". 2 days prior his respiratory effort began to gradually worsen. Associated symptoms included nonproductive cough and fever 2 episodes of diarrhea. He denied chest pain palpitations headache dizziness syncope or near-syncope. He denied lower extremity edema orthopnea. He denied abdominal pain nausea vomiting dysuria hematuria frequency or urgency. Wife reported over the previous 2 days he'd been using his inhalers but his respiratory status continued to worsen. During this time patient refused to go to the hospital.   Workup in the  emergency department revealed sodium 133, platelets 139, glucose 122. Chest x-ray no active cardiopulmonary disease. Changes of COPD stable. EKG ectopic atrial tachycardia, unifocal ventricular premature complex  While in the emergency department he was provided with Solu-Medrol and Levaquin broncho-dilator 2. Respiratory rate 30 and he was placed on BiPAP. He gradually improved and was referred for admission.  Hospital Course:  Acute respiratory failure: Likely related to COPD exacerbation in setting of worsening lung disease. He required BIPAP but quickly weaned to [facility]. Was provided with bronchodilaters,steroids and antibiotic. At discharge remained hypoxic on room air so he will be discharged with continuous oxygen. Oxygen saturation level >90% with oxygen 2L [place]. Will discharge with steroid taper, spiriva, symbicort and proair as well as antibiotic. Will need close OP follow up with PCP for evaluation of respiratory status. Recommend PFT's. Office will call patient to schedule appointment  Active Problems:  COPD exacerbation: Patient reported respiratory effort at discharge "close to normal for him". Reported his breathing had been bad "for a long time". Just recently (July) established with PCP and inhalers provided. Will likely benefit from pulmonary function test on outpatient basis. See above  Hyperglycemia: likely steroid induced. No hx diabetes Steroid taper on discharge. OP follow up    Tachycardia: mild. Likely related to nebs. No chest pain.Troponin negative x2. Resolved at discharge  Anxiety: reported restlessness and inability to sleep. Resolved at discharge.    Hyponatremia: Mild. resolved   Thrombocytopenia: Mild. Trending up.No signs of bleeding. OP follow up.    Procedures:  none  Consultations:  none  Discharge Exam: Filed Vitals:   10/04/14 0545  BP: 128/83  Pulse: 104  Temp: 98.2 F (36.8  C)  Resp: 18    General: appears comfortable Cardiovascular:  RRR no MGR no LE edema Respiratory: mild increased work of breathing with conversation. Air flow improved but still diminished particularly in bases. No wheeze  Discharge Instructions   Discharge Instructions    Diet - low sodium heart healthy    Complete by:  As directed      Discharge instructions    Complete by:  As directed   Take medication as directed Wear oxygen as instructed Follow up with PCP office will call you for appointment. If they have not contacted you by 9/15 call the office to schedule     Increase activity slowly    Complete by:  As directed           Current Discharge Medication List    START taking these medications   Details  budesonide-formoterol (SYMBICORT) 160-4.5 MCG/ACT inhaler Inhale 2 puffs into the lungs 2 (two) times daily. Qty: 1 Inhaler, Refills: 12    guaiFENesin-dextromethorphan (ROBITUSSIN DM) 100-10 MG/5ML syrup Take 5 mLs by mouth every 4 (four) hours as needed for cough. Qty: 118 mL, Refills: 0    levofloxacin (LEVAQUIN) 750 MG tablet Take 1 tablet (750 mg total) by mouth daily. Qty: 4 tablet, Refills: 0    predniSONE (DELTASONE) 10 MG tablet Take 6 tabs on 9/15 then take 5 tabs for 1 day then take 4 tabs for 1 day then take 3 tabs for 1 day then take 2 tabs for 1 day then take 1 tab for 1 day then stop Qty: 21 tablet, Refills: 0      CONTINUE these medications which have NOT CHANGED   Details  PROAIR HFA 108 (90 BASE) MCG/ACT inhaler Inhale 2 puffs into the lungs every 6 (six) hours as needed. Shortness of breath,cough(rescue inhaler) Refills: 1    SPIRIVA RESPIMAT 2.5 MCG/ACT AERS Inhale 2 puffs into the lungs daily. Refills: 1       Allergies  Allergen Reactions  . Percocet [Oxycodone-Acetaminophen] Rash and Other (See Comments)    Has cold sweats   Follow-up Information    Follow up with JACKSON,SAMANTHA, PA-C.   Specialty:  Family Medicine   Why:  office will contact for appointment   Contact information:   [address] [place] [address] [phone]        The results of significant diagnostics from this hospitalization (including imaging, microbiology, ancillary and laboratory) are listed below for reference.    Significant Diagnostic Studies: Dg Chest Port 1 View  10/02/2014   CLINICAL DATA:  Productive cough, fever, shortness of breath for 2 days.  EXAM: PORTABLE CHEST - 1 VIEW  COMPARISON:  July 25, 2014  FINDINGS: The heart size and mediastinal contours are stable. The aorta is tortuous. There is questioned dilatation of the ascending in the aorta versus tortuosity of the aorta unchanged. The lungs are hyperinflated. There is no focal infiltrate, pulmonary edema, or pleural effusion. The visualized skeletal structures are stable.  IMPRESSION: No active cardiopulmonary disease.  Changes of COPD stable.   Electronically Signed   By: Sherian Rein M.D.   On: 10/02/2014 08:11    Microbiology: Recent Results (from the past 240 hour(s))  Blood Culture (routine x 2)     Status: None (Preliminary result)   Collection Time: 10/02/14  7:45 AM  Result Value Ref Range Status   Specimen Description BLOOD RIGHT HAND  Final   Special Requests BOTTLES DRAWN AEROBIC ONLY 5CC  Final  Culture NO GROWTH 2 DAYS  Final   Report Status PENDING  Incomplete  Blood Culture (routine x 2)     Status: None (Preliminary result)   Collection Time: 10/02/14  7:50 AM  Result Value Ref Range Status   Specimen Description BLOOD RIGHT WRIST  Final   Special Requests BOTTLES DRAWN AEROBIC AND ANAEROBIC 4CC  Final   Culture NO GROWTH 2 DAYS  Final   Report Status PENDING  Incomplete  Urine culture     Status: None (Preliminary result)   Collection Time: 10/02/14  8:30 AM  Result Value Ref Range Status   Specimen Description URINE, CLEAN CATCH  Final   Special Requests NONE  Final   Culture   Final    TOO YOUNG TO READ Performed at [facility]    Report Status PENDING  Incomplete  MRSA PCR  Screening     Status: None   Collection Time: 10/02/14  6:30 PM  Result Value Ref Range Status   MRSA by PCR NEGATIVE NEGATIVE Final    Comment:        The GeneXpert MRSA Assay (FDA approved for NASAL specimens only), is one component of a comprehensive MRSA colonization surveillance program. It is not intended to diagnose MRSA infection nor to guide or monitor treatment for MRSA infections.      Labs: Basic Metabolic Panel:  Recent Labs Lab 10/02/14 0722 10/03/14 0500  NA 133* 136  K 4.2 3.7  CL 94* 101  CO2 29 26  GLUCOSE 122* 179*  BUN 25* 18  CREATININE 0.87 0.86  CALCIUM 9.3 8.5*   Liver Function Tests:  Recent Labs Lab 10/02/14 0722 10/03/14 0500  AST 36 29  ALT 25 19  ALKPHOS 98 78  BILITOT 1.7* 0.7  PROT 8.8* 6.8  ALBUMIN 4.7 3.6   No results for input(s): LIPASE, AMYLASE in the last 168 hours. No results for input(s): AMMONIA in the last 168 hours. CBC:  Recent Labs Lab 10/02/14 0722 10/03/14 0500  WBC 5.9 6.3  NEUTROABS 4.4  --   HGB 16.3 13.3  HCT 45.7 37.8*  MCV 87.2 86.3  PLT 139* 140*   Cardiac Enzymes:  Recent Labs Lab 10/02/14 0722 10/02/14 1358  TROPONINI 0.03 <0.03   BNP: BNP (last 3 results)  Recent Labs  10/02/14 0723  BNP 58.0    ProBNP (last 3 results) No results for input(s): PROBNP in the last 8760 hours.  CBG:  Recent Labs Lab 10/03/14 1133 10/03/14 1630 10/03/14 2009 10/04/14 0727  GLUCAP 179* 134* 231* 120*       Signed:  BLACK,KAREN M  [facility] 10/04/2014, 11:19 AM

## 2014-10-04 NOTE — Progress Notes (Signed)
Pt. discharged home today. Pt. Received discharge instructions, prescriptions and care notes. Pt. Verbalized understanding and has no questions or concerns at this time. Pt.'s IV removed with catheter intact,no bleeding or complications. Pt. Left unit with O2 in use via nasal cannula. Pt. Left unit in stable condition in wheelchair with staff member.

## 2014-10-04 NOTE — Care Management Note (Signed)
Case Management Note  Patient Details  Name: Roger Arnold MRN: [id number] Date of Birth: 02-Jun-1955  Expected Discharge Date:    10/04/2014              Expected Discharge Plan:  Home/Self Care  In-House Referral:  NA  Discharge planning Services  CM Consult  Post Acute Care Choice:  Durable Medical Equipment Choice offered to:  Patient  DME Arranged:  Oxygen, Nebulizer/meds DME Agency:  [facility]  HH Arranged:    HH Agency:     Status of Service:  Completed, signed off  Medicare Important Message Given:    Date Medicare IM Given:    Medicare IM give by:    Date Additional Medicare IM Given:    Additional Medicare Important Message give by:     If discussed at Long Length of Stay Meetings, dates discussed:    Additional Comments: Pt admitted with COPD exacerbation. Pt is from home with wife and ind at baseline. Pt discharging home today with self care. Pt meets requirements for home O2 and neb machine has been ordered. Pt has chosen [facility] for DME needs. Spoke with [place] at [facility] and pt info faxed. O2 tank and neb machine will be delivered to pt's room prior to DC. No further CM needs noted.   Malcolm Metro, RN 10/04/2014, 10:46 AM

## 2014-10-07 LAB — CULTURE, BLOOD (ROUTINE X 2)
CULTURE: NO GROWTH
Culture: NO GROWTH

## 2016-06-07 ENCOUNTER — Encounter (HOSPITAL_COMMUNITY): Payer: Self-pay | Admitting: Emergency Medicine

## 2016-06-07 ENCOUNTER — Emergency Department (HOSPITAL_COMMUNITY): Payer: BLUE CROSS/BLUE SHIELD

## 2016-06-07 ENCOUNTER — Inpatient Hospital Stay (HOSPITAL_COMMUNITY)
Admission: EM | Admit: 2016-06-07 | Discharge: 2016-06-20 | DRG: 190 | Disposition: E | Payer: BLUE CROSS/BLUE SHIELD | Attending: Family Medicine | Admitting: Family Medicine

## 2016-06-07 DIAGNOSIS — J9621 Acute and chronic respiratory failure with hypoxia: Secondary | ICD-10-CM | POA: Diagnosis present

## 2016-06-07 DIAGNOSIS — Z79899 Other long term (current) drug therapy: Secondary | ICD-10-CM | POA: Diagnosis not present

## 2016-06-07 DIAGNOSIS — F17210 Nicotine dependence, cigarettes, uncomplicated: Secondary | ICD-10-CM | POA: Diagnosis present

## 2016-06-07 DIAGNOSIS — Z66 Do not resuscitate: Secondary | ICD-10-CM | POA: Diagnosis not present

## 2016-06-07 DIAGNOSIS — I719 Aortic aneurysm of unspecified site, without rupture: Secondary | ICD-10-CM | POA: Diagnosis not present

## 2016-06-07 DIAGNOSIS — Z23 Encounter for immunization: Secondary | ICD-10-CM | POA: Diagnosis not present

## 2016-06-07 DIAGNOSIS — Z515 Encounter for palliative care: Secondary | ICD-10-CM | POA: Diagnosis not present

## 2016-06-07 DIAGNOSIS — Z885 Allergy status to narcotic agent status: Secondary | ICD-10-CM | POA: Diagnosis not present

## 2016-06-07 DIAGNOSIS — G9341 Metabolic encephalopathy: Secondary | ICD-10-CM | POA: Diagnosis present

## 2016-06-07 DIAGNOSIS — Z7951 Long term (current) use of inhaled steroids: Secondary | ICD-10-CM | POA: Diagnosis not present

## 2016-06-07 DIAGNOSIS — G9340 Encephalopathy, unspecified: Secondary | ICD-10-CM | POA: Diagnosis not present

## 2016-06-07 DIAGNOSIS — I4510 Unspecified right bundle-branch block: Secondary | ICD-10-CM | POA: Diagnosis present

## 2016-06-07 DIAGNOSIS — Z6820 Body mass index (BMI) 20.0-20.9, adult: Secondary | ICD-10-CM

## 2016-06-07 DIAGNOSIS — R000 Tachycardia, unspecified: Secondary | ICD-10-CM | POA: Diagnosis present

## 2016-06-07 DIAGNOSIS — Z9981 Dependence on supplemental oxygen: Secondary | ICD-10-CM

## 2016-06-07 DIAGNOSIS — Z825 Family history of asthma and other chronic lower respiratory diseases: Secondary | ICD-10-CM | POA: Diagnosis not present

## 2016-06-07 DIAGNOSIS — R627 Adult failure to thrive: Secondary | ICD-10-CM | POA: Diagnosis present

## 2016-06-07 DIAGNOSIS — E43 Unspecified severe protein-calorie malnutrition: Secondary | ICD-10-CM | POA: Diagnosis present

## 2016-06-07 DIAGNOSIS — R0602 Shortness of breath: Secondary | ICD-10-CM | POA: Diagnosis present

## 2016-06-07 DIAGNOSIS — R4182 Altered mental status, unspecified: Secondary | ICD-10-CM

## 2016-06-07 DIAGNOSIS — J9622 Acute and chronic respiratory failure with hypercapnia: Secondary | ICD-10-CM

## 2016-06-07 DIAGNOSIS — I712 Thoracic aortic aneurysm, without rupture: Secondary | ICD-10-CM | POA: Diagnosis present

## 2016-06-07 DIAGNOSIS — J441 Chronic obstructive pulmonary disease with (acute) exacerbation: Principal | ICD-10-CM

## 2016-06-07 DIAGNOSIS — Z7189 Other specified counseling: Secondary | ICD-10-CM

## 2016-06-07 LAB — BLOOD GAS, ARTERIAL
ACID-BASE EXCESS: 6.5 mmol/L — AB (ref 0.0–2.0)
BICARBONATE: 29 mmol/L — AB (ref 20.0–28.0)
DELIVERY SYSTEMS: POSITIVE
DRAWN BY: 221791
Expiratory PAP: 8
FIO2: 40
Inspiratory PAP: 18
LHR: 10 {breaths}/min
O2 SAT: 98.5 %
PCO2 ART: 59.7 mmHg — AB (ref 32.0–48.0)
PH ART: 7.348 — AB (ref 7.350–7.450)
Patient temperature: 37
pO2, Arterial: 129 mmHg — ABNORMAL HIGH (ref 83.0–108.0)

## 2016-06-07 LAB — COMPREHENSIVE METABOLIC PANEL
ALT: 14 U/L — ABNORMAL LOW (ref 17–63)
ANION GAP: 8 (ref 5–15)
AST: 22 U/L (ref 15–41)
Albumin: 4.3 g/dL (ref 3.5–5.0)
Alkaline Phosphatase: 118 U/L (ref 38–126)
BILIRUBIN TOTAL: 0.7 mg/dL (ref 0.3–1.2)
BUN: 15 mg/dL (ref 6–20)
CO2: 35 mmol/L — ABNORMAL HIGH (ref 22–32)
Calcium: 9.5 mg/dL (ref 8.9–10.3)
Chloride: 97 mmol/L — ABNORMAL LOW (ref 101–111)
Creatinine, Ser: 0.91 mg/dL (ref 0.61–1.24)
Glucose, Bld: 105 mg/dL — ABNORMAL HIGH (ref 65–99)
POTASSIUM: 3.8 mmol/L (ref 3.5–5.1)
Sodium: 140 mmol/L (ref 135–145)
TOTAL PROTEIN: 7.5 g/dL (ref 6.5–8.1)

## 2016-06-07 LAB — I-STAT CHEM 8, ED
BUN: 15 mg/dL (ref 6–20)
CREATININE: 1 mg/dL (ref 0.61–1.24)
Calcium, Ion: 1.08 mmol/L — ABNORMAL LOW (ref 1.15–1.40)
Chloride: 99 mmol/L — ABNORMAL LOW (ref 101–111)
GLUCOSE: 101 mg/dL — AB (ref 65–99)
HCT: 39 % (ref 39.0–52.0)
HEMOGLOBIN: 13.3 g/dL (ref 13.0–17.0)
Potassium: 3.7 mmol/L (ref 3.5–5.1)
Sodium: 138 mmol/L (ref 135–145)
TCO2: 32 mmol/L (ref 0–100)

## 2016-06-07 LAB — URINALYSIS, ROUTINE W REFLEX MICROSCOPIC
BACTERIA UA: NONE SEEN
BILIRUBIN URINE: NEGATIVE
Glucose, UA: NEGATIVE mg/dL
KETONES UR: NEGATIVE mg/dL
LEUKOCYTES UA: NEGATIVE
Nitrite: NEGATIVE
PH: 5 (ref 5.0–8.0)
Protein, ur: NEGATIVE mg/dL
SQUAMOUS EPITHELIAL / LPF: NONE SEEN
Specific Gravity, Urine: 1.021 (ref 1.005–1.030)

## 2016-06-07 LAB — CBC WITH DIFFERENTIAL/PLATELET
Basophils Absolute: 0 10*3/uL (ref 0.0–0.1)
Basophils Relative: 0 %
EOS ABS: 0.1 10*3/uL (ref 0.0–0.7)
EOS PCT: 2 %
HCT: 40.4 % (ref 39.0–52.0)
Hemoglobin: 13.6 g/dL (ref 13.0–17.0)
Lymphocytes Relative: 7 %
Lymphs Abs: 0.5 10*3/uL — ABNORMAL LOW (ref 0.7–4.0)
MCH: 29.7 pg (ref 26.0–34.0)
MCHC: 33.7 g/dL (ref 30.0–36.0)
MCV: 88.2 fL (ref 78.0–100.0)
MONOS PCT: 11 %
Monocytes Absolute: 0.8 10*3/uL (ref 0.1–1.0)
NEUTROS ABS: 5.6 10*3/uL (ref 1.7–7.7)
NEUTROS PCT: 80 %
PLATELETS: 250 10*3/uL (ref 150–400)
RBC: 4.58 MIL/uL (ref 4.22–5.81)
RDW: 13.6 % (ref 11.5–15.5)
WBC: 7 10*3/uL (ref 4.0–10.5)

## 2016-06-07 LAB — TSH: TSH: 0.559 u[IU]/mL (ref 0.350–4.500)

## 2016-06-07 LAB — I-STAT TROPONIN, ED: TROPONIN I, POC: 0.01 ng/mL (ref 0.00–0.08)

## 2016-06-07 LAB — I-STAT CG4 LACTIC ACID, ED: Lactic Acid, Venous: 1.32 mmol/L (ref 0.5–1.9)

## 2016-06-07 LAB — BRAIN NATRIURETIC PEPTIDE: B NATRIURETIC PEPTIDE 5: 55 pg/mL (ref 0.0–100.0)

## 2016-06-07 LAB — LIPASE, BLOOD: LIPASE: 14 U/L (ref 11–51)

## 2016-06-07 LAB — MRSA PCR SCREENING: MRSA BY PCR: NEGATIVE

## 2016-06-07 LAB — D-DIMER, QUANTITATIVE (NOT AT ARMC): D DIMER QUANT: 0.59 ug{FEU}/mL — AB (ref 0.00–0.50)

## 2016-06-07 LAB — ETHANOL

## 2016-06-07 MED ORDER — POLYETHYLENE GLYCOL 3350 17 G PO PACK: 17.0000 g | PACK | Freq: Every day | ORAL | Status: DC | PRN

## 2016-06-07 MED ORDER — PNEUMOCOCCAL VAC POLYVALENT 25 MCG/0.5ML IJ INJ
0.5000 mL | INJECTION | INTRAMUSCULAR | Status: AC
  Administered 2016-06-08: 0.5 mL via INTRAMUSCULAR
  Filled 2016-06-07: qty 0.5

## 2016-06-07 MED ORDER — SODIUM CHLORIDE 0.9 % IV SOLN
INTRAVENOUS | Status: DC
  Administered 2016-06-07: 14:00:00 via INTRAVENOUS

## 2016-06-07 MED ORDER — GUAIFENESIN-DM 100-10 MG/5ML PO SYRP
5.0000 mL | ORAL_SOLUTION | ORAL | Status: DC | PRN
  Filled 2016-06-07: qty 5

## 2016-06-07 MED ORDER — METHYLPREDNISOLONE SODIUM SUCC 125 MG IJ SOLR
125.0000 mg | Freq: Once | INTRAMUSCULAR | Status: AC
  Administered 2016-06-07: 125 mg via INTRAVENOUS
  Filled 2016-06-07: qty 2

## 2016-06-07 MED ORDER — ENOXAPARIN SODIUM 40 MG/0.4ML ~~LOC~~ SOLN
40.0000 mg | SUBCUTANEOUS | Status: DC
  Administered 2016-06-07 – 2016-06-09 (×3): 40 mg via SUBCUTANEOUS
  Filled 2016-06-07 (×3): qty 0.4

## 2016-06-07 MED ORDER — MOMETASONE FURO-FORMOTEROL FUM 200-5 MCG/ACT IN AERO
2.0000 | INHALATION_SPRAY | Freq: Two times a day (BID) | RESPIRATORY_TRACT | Status: DC
  Administered 2016-06-07 – 2016-06-10 (×6): 2 via RESPIRATORY_TRACT
  Filled 2016-06-07: qty 8.8

## 2016-06-07 MED ORDER — IPRATROPIUM BROMIDE 0.02 % IN SOLN
0.5000 mg | Freq: Once | RESPIRATORY_TRACT | Status: AC
  Administered 2016-06-07: 0.5 mg via RESPIRATORY_TRACT

## 2016-06-07 MED ORDER — IBUPROFEN 400 MG PO TABS: 400.0000 mg | ORAL_TABLET | Freq: Four times a day (QID) | ORAL | Status: DC | PRN

## 2016-06-07 MED ORDER — METHYLPREDNISOLONE SODIUM SUCC 125 MG IJ SOLR
60.0000 mg | Freq: Four times a day (QID) | INTRAMUSCULAR | Status: DC
  Administered 2016-06-07 – 2016-06-11 (×16): 60 mg via INTRAVENOUS
  Filled 2016-06-07: qty 0.96
  Filled 2016-06-07: qty 2
  Filled 2016-06-07: qty 0.96
  Filled 2016-06-07: qty 2
  Filled 2016-06-07: qty 0.96
  Filled 2016-06-07 (×2): qty 2
  Filled 2016-06-07: qty 0.96
  Filled 2016-06-07 (×5): qty 2
  Filled 2016-06-07 (×2): qty 0.96
  Filled 2016-06-07 (×3): qty 2
  Filled 2016-06-07: qty 0.96
  Filled 2016-06-07 (×2): qty 2
  Filled 2016-06-07: qty 0.96
  Filled 2016-06-07 (×3): qty 2

## 2016-06-07 MED ORDER — MOMETASONE FURO-FORMOTEROL FUM 200-5 MCG/ACT IN AERO
INHALATION_SPRAY | RESPIRATORY_TRACT | Status: AC
  Filled 2016-06-07: qty 8.8

## 2016-06-07 MED ORDER — IPRATROPIUM-ALBUTEROL 0.5-2.5 (3) MG/3ML IN SOLN
3.0000 mL | RESPIRATORY_TRACT | Status: DC | PRN
  Filled 2016-06-07: qty 3

## 2016-06-07 MED ORDER — ALBUTEROL SULFATE (2.5 MG/3ML) 0.083% IN NEBU
5.0000 mg | INHALATION_SOLUTION | Freq: Once | RESPIRATORY_TRACT | Status: AC
  Administered 2016-06-07: 5 mg via RESPIRATORY_TRACT

## 2016-06-07 MED ORDER — ONDANSETRON HCL 4 MG/2ML IJ SOLN
4.0000 mg | Freq: Four times a day (QID) | INTRAMUSCULAR | Status: DC | PRN
  Filled 2016-06-07: qty 2

## 2016-06-07 MED ORDER — IPRATROPIUM BROMIDE 0.02 % IN SOLN
RESPIRATORY_TRACT | Status: AC
  Filled 2016-06-07: qty 2.5

## 2016-06-07 MED ORDER — ALBUTEROL SULFATE (2.5 MG/3ML) 0.083% IN NEBU
INHALATION_SOLUTION | RESPIRATORY_TRACT | Status: AC
  Filled 2016-06-07: qty 6

## 2016-06-07 MED ORDER — ONDANSETRON HCL 4 MG PO TABS: 4.0000 mg | ORAL_TABLET | Freq: Four times a day (QID) | ORAL | Status: DC | PRN

## 2016-06-07 MED ORDER — DEXTROSE 5 % IV SOLN
INTRAVENOUS | Status: AC
  Filled 2016-06-07: qty 500

## 2016-06-07 MED ORDER — SODIUM CHLORIDE 0.9 % IV BOLUS (SEPSIS)
500.0000 mL | Freq: Once | INTRAVENOUS | Status: AC
  Administered 2016-06-07: 500 mL via INTRAVENOUS

## 2016-06-07 MED ORDER — FAMOTIDINE 20 MG PO TABS
20.0000 mg | ORAL_TABLET | Freq: Every day | ORAL | Status: DC
  Administered 2016-06-09 – 2016-06-10 (×2): 20 mg via ORAL
  Filled 2016-06-07 (×3): qty 1

## 2016-06-07 MED ORDER — IOPAMIDOL (ISOVUE-300) INJECTION 61%
100.0000 mL | Freq: Once | INTRAVENOUS | Status: AC | PRN
  Administered 2016-06-07: 100 mL via INTRAVENOUS

## 2016-06-07 MED ORDER — HYDRALAZINE HCL 20 MG/ML IJ SOLN
5.0000 mg | INTRAMUSCULAR | Status: DC | PRN
  Administered 2016-06-11: 10 mg via INTRAVENOUS
  Filled 2016-06-07: qty 1

## 2016-06-07 MED ORDER — DEXTROSE 5 % IV SOLN
500.0000 mg | INTRAVENOUS | Status: DC
  Administered 2016-06-07 – 2016-06-09 (×3): 500 mg via INTRAVENOUS
  Filled 2016-06-07 (×3): qty 500

## 2016-06-07 MED ORDER — IPRATROPIUM-ALBUTEROL 0.5-2.5 (3) MG/3ML IN SOLN
3.0000 mL | Freq: Four times a day (QID) | RESPIRATORY_TRACT | Status: DC
  Administered 2016-06-07 – 2016-06-11 (×14): 3 mL via RESPIRATORY_TRACT
  Filled 2016-06-07 (×24): qty 3

## 2016-06-07 MED ORDER — SODIUM CHLORIDE 0.9 % IV SOLN
INTRAVENOUS | Status: AC
  Administered 2016-06-07: 800 mL via INTRAVENOUS

## 2016-06-07 MED ORDER — SODIUM CHLORIDE 0.9% FLUSH
3.0000 mL | Freq: Two times a day (BID) | INTRAVENOUS | Status: DC
  Administered 2016-06-07 – 2016-06-11 (×7): 3 mL via INTRAVENOUS

## 2016-06-07 MED ORDER — BISACODYL 5 MG PO TBEC: 5.0000 mg | DELAYED_RELEASE_TABLET | Freq: Every day | ORAL | Status: DC | PRN

## 2016-06-07 NOTE — ED Notes (Signed)
CRITICAL VALUE ALERT  Critical value received:  PH-7.348, pCO2-59.7, pO2-129, HCO3-29, SO2-98  Date of notification:  06/19/2016  Time of notification:  1415  Critical value read back:Yes.    Nurse who received alert:  Berdine DanceMandi Rishaan Gunner RN  MD notified (1st page):  Deretha EmoryZackowski  Time of first page:  1415  MD notified (2nd page):  Time of second page:  Responding MD:  Deretha EmoryZackowski  Time MD responded:  1415

## 2016-06-07 NOTE — ED Provider Notes (Signed)
Blood pressure 115/71, pulse 93, temperature 97.8 F (36.6 C), temperature source Oral, resp. rate 15, height 5\' 9"  (1.753 m), weight 153 lb (69.4 kg), SpO2 100 %.  Assuming care from Dr. Jodelle GrossZakowski.  In short, Roger Arnold is a 61 y.o. male with a chief complaint of Shortness of Breath .  Refer to the original H&P for additional details.  The current plan of care is to follow up patient after CT head and abdomen/pelvis.  04:22 PM Reevaluated patient. He means drowsy but awakens to voice or light touch. Indicates that he feels he is breathing better on BiPAP. CT head negative. CT abdomen and pelvis with no acute findings. He does have some nonspecific findings on CT but do not feel they adequately explain his presentation. At this time is most consistent with COPD exacerbation and possible CO2 narcosis. Appears to be improving on BiPAP. Plan to continue this and admitted to the hospital. He's had nebulizer treatments and steroid. He indicated to Dr. Deretha EmoryZackowski that he would not want to be intubated if needed. No family at bedside for update or additional history.   Discussed patient's case with hospitalist, Dr. Antionette Charpyd.  Patient and family (if present) updated with plan. Care transferred to hospitalist service.  I reviewed all nursing notes, vitals, pertinent old records, EKGs, labs, imaging (as available).   EKG Interpretation  Date/Time:  Saturday Jun 07 2016 13:21:10 EDT Ventricular Rate:  112 PR Interval:    QRS Duration: 178 QT Interval:  403 QTC Calculation: 551 R Axis:   -139 Text Interpretation:  Sinus tachycardia Multiform ventricular premature complexes Right bundle branch block Artifact in lead(s) III V4 V5 Interpretation limited secondary to artifact Confirmed by Vanetta MuldersZackowski, Scott [id number] on 17-Sep-2016 1:46:41 PM      Alona BeneJoshua Long, MD Emergency Medicine   Arnold, Arlyss RepressJoshua G, MD 12/19/2016 [phone]

## 2016-06-07 NOTE — ED Notes (Signed)
Pt's wife went home and can be reached at [phone].  Her name [place].

## 2016-06-07 NOTE — Progress Notes (Signed)
In to see patient at this time. Patient's wife expressed concern with care in the ED. The patient's wife expressed a complaint about not being notified about her husband being admitted to the unit. Talked with wife about ED experience and discussed what happened when she left (CT abd. head) etc and admission. Offered to provide house supervisor to come and speak with her. House supervisor updated about patient complaint.  Wife declined to speak with house supervisor at this time, and felt she was content after discussing the situation at this time. She stated her care was exceptional since arrival on the unit. No further questions or issues from patient family at this time.

## 2016-06-07 NOTE — ED Triage Notes (Signed)
Pt brought in by ems for shortness of breath.  States they have been called multiple times today and the last time was also for altered mental status.  Pt's sats on arrival to ED were 75% on 2L [place].  O2 increased briefly and stats increased.  Pt now on 3L.  Pt is lethargic, but oriented.

## 2016-06-07 NOTE — ED Provider Notes (Addendum)
[facility] DEPT Provider Note   CSN: [id number] Arrival date & time: 06/11/2016  1315     History   Chief Complaint Chief Complaint  Patient presents with  . Shortness of Breath    HPI Roger Arnold is a 61 y.o. male.  Brought in by EMS. Discussed with patient's wife. Apparently he was not as responsive today is usual. Patient does have a history of COPD normally on 2 L of oxygen. Patient's wife states that patient has not seen a doctor in a year followed by [place] usually. Patient also has not had a bath or shower in the year and only eats when she feeds him. She's called EMS several times to have them bring him in but he refuses. Upon arrival here patient refused intubation. But was in agreement to BiPAP. Patient was initially able to answer questions prior to going on BiPAP. On seen to be answering questions appropriately. Stating he was having trouble breathing. No wheezing but not moving air very well. Denied any other complaints. Patient does not have any known behavioral health history.      Past Medical History:  Diagnosis Date  . Arthritis   . Back pain   . COPD (chronic obstructive pulmonary disease) [facility])     Patient Active Problem List   Diagnosis Date Noted  . Hyperglycemia 10/03/2014  . Acute respiratory failure, unspecified whether with hypoxia or hypercapnia (HCC)   . COPD exacerbation (HCC) 10/02/2014  . Acute respiratory failure (HCC) 10/02/2014  . Tachycardia 10/02/2014  . Hyponatremia 10/02/2014  . Thrombocytopenia (HCC) 10/02/2014    History reviewed. No pertinent surgical history.     Home Medications    Prior to Admission medications   Medication Sig Start Date End Date Taking? Authorizing Provider  budesonide-formoterol (SYMBICORT) 160-4.5 MCG/ACT inhaler Inhale 2 puffs into the lungs 2 (two) times daily. 10/04/14  Yes Black, Lesle Chris, NP  PROAIR HFA 108 (90 BASE) MCG/ACT inhaler Inhale 2 puffs into the lungs every 6 (six) hours as needed.  Shortness of breath,cough(rescue inhaler) 08/31/14  Yes [provider]  guaiFENesin-dextromethorphan (ROBITUSSIN DM) 100-10 MG/5ML syrup Take 5 mLs by mouth every 4 (four) hours as needed for cough. 10/04/14   Black, Lesle Chris, NP  levofloxacin (LEVAQUIN) 750 MG tablet Take 1 tablet (750 mg total) by mouth daily. 10/04/14   Black, Lesle Chris, NP  predniSONE (DELTASONE) 10 MG tablet Take 6 tabs on 9/15 then take 5 tabs for 1 day then take 4 tabs for 1 day then take 3 tabs for 1 day then take 2 tabs for 1 day then take 1 tab for 1 day then stop 10/04/14   Gwenyth Bender, NP  SPIRIVA RESPIMAT 2.5 MCG/ACT AERS Inhale 2 puffs into the lungs daily. 09/01/14   [provider]    Family History History reviewed. No pertinent family history.  Social History Social History  Substance Use Topics  . Smoking status: Current Every Day Smoker    Types: Cigarettes  . Smokeless tobacco: Not on file  . Alcohol use No     Allergies   Percocet [oxycodone-acetaminophen]   Review of Systems Review of Systems  Constitutional: Negative for fever.  HENT: Negative for congestion.   Eyes: Negative for visual disturbance.  Respiratory: Positive for shortness of breath. Negative for cough.   Cardiovascular: Negative for chest pain.  Gastrointestinal: Negative for abdominal pain, diarrhea, nausea and vomiting.  Genitourinary: Negative for dysuria and hematuria.  Musculoskeletal: Negative for back pain.  Skin:  Negative for rash and wound.  Neurological: Negative for headaches.  Hematological: Does not bruise/bleed easily.  Psychiatric/Behavioral: Negative for confusion.     Physical Exam Updated Vital Signs BP (!) 103/54   Pulse 87   Temp 97.8 F (36.6 C) (Oral)   Resp 15   Ht 5\' 9"  (1.753 m)   Wt 153 lb (69.4 kg)   SpO2 100%   BMI 22.59 kg/m   Physical Exam  Constitutional: He appears well-developed and well-nourished. He appears distressed.  HENT:  Head: Normocephalic and  atraumatic.  Mucous membranes dry.  Eyes: Conjunctivae and EOM are normal. Pupils are equal, round, and reactive to light.  Neck: Normal range of motion. Neck supple.  Cardiovascular: Regular rhythm and normal heart sounds.   Slightly tachycardic.  Pulmonary/Chest: Breath sounds normal. He is in respiratory distress. He has no wheezes.  Decreased breath sounds bilaterally no wheezing  Abdominal: Soft. Bowel sounds are normal. There is no tenderness.  Musculoskeletal: Normal range of motion.  Trace edema both legs. Strong dorsalis pedis pulse.  Neurological: He is alert. No cranial nerve deficit or sensory deficit. He exhibits normal muscle tone. Coordination normal.  Skin: Skin is warm.  Nursing note and vitals reviewed.    ED Treatments / Results  Labs (all labs ordered are listed, but only abnormal results are displayed) Labs Reviewed  COMPREHENSIVE METABOLIC PANEL - Abnormal; Notable for the following:       Result Value   Chloride 97 (*)    CO2 35 (*)    Glucose, Bld 105 (*)    ALT 14 (*)    All other components within normal limits  CBC WITH DIFFERENTIAL/PLATELET - Abnormal; Notable for the following:    Lymphs Abs 0.5 (*)    All other components within normal limits  BLOOD GAS, ARTERIAL - Abnormal; Notable for the following:    pH, Arterial 7.348 (*)    pCO2 arterial 59.7 (*)    pO2, Arterial 129 (*)    Bicarbonate 29.0 (*)    Acid-Base Excess 6.5 (*)    All other components within normal limits  I-STAT CHEM 8, ED - Abnormal; Notable for the following:    Chloride 99 (*)    Glucose, Bld 101 (*)    Calcium, Ion 1.08 (*)    All other components within normal limits  LIPASE, BLOOD  ETHANOL  BRAIN NATRIURETIC PEPTIDE  URINALYSIS, ROUTINE W REFLEX MICROSCOPIC  I-STAT TROPOININ, ED  I-STAT CG4 LACTIC ACID, ED    EKG  EKG Interpretation  Date/Time:  Saturday Jun 07 2016 13:21:10 EDT Ventricular Rate:  112 PR Interval:    QRS Duration: 178 QT  Interval:  403 QTC Calculation: 551 R Axis:   -139 Text Interpretation:  Sinus tachycardia Multiform ventricular premature complexes Right bundle branch block Artifact in lead(s) III V4 V5 Interpretation limited secondary to artifact Confirmed by Vanetta Mulders [phone]) on 06/18/2016 1:46:41 PM       Radiology Dg Chest Portable 1 View  Result Date: 05/22/2016 CLINICAL DATA:  Shortness of breath and altered mental status. EXAM: PORTABLE CHEST 1 VIEW COMPARISON:  09/21/2014 FINDINGS: Lungs are hyperexpanded without focal consolidation or effusion. Mild stable tenting of the left hemidiaphragm. Cardiac silhouette is within normal. Minimal calcification and tortuosity of the thoracic aorta. Remainder of the exam is unchanged. IMPRESSION: No acute cardiopulmonary disease. COPD. Aortic atherosclerosis. Electronically Signed   By: Elberta Fortis M.D.   On: 05/30/2016 14:02    Procedures Procedures (including critical care  time)  CRITICAL CARE Performed by: Tammi Boulier Total critical care time:  30 minutes Critical care time was exclusive of separately billable procedures and treating other patients. Critical care was necessary to treat or prevent imminent or life-threatening deterioration. Critical care was time spent personally by me on the following activities: development of treatment plan with patient and/or surrogate as well as nursing, discussions with consultants, evaluation of patient's response to treatment, examination of patient, obtaining history from patient or surrogate, ordering and performing treatments and interventions, ordering and review of laboratory studies, ordering and review of radiographic studies, pulse oximetry and re-evaluation of patient's condition.   Medications Ordered in ED Medications  ipratropium (ATROVENT) 0.02 % nebulizer solution (  Not Given 06/14/2016 1345)  albuterol (PROVENTIL) (2.5 MG/3ML) 0.083% nebulizer solution (  Not Given 06/08/2016 1345)  0.9 %   sodium chloride infusion ( Intravenous New Bag/Given 06/05/2016 1352)  albuterol (PROVENTIL) (2.5 MG/3ML) 0.083% nebulizer solution 5 mg (5 mg Nebulization Given 06/03/2016 1345)  ipratropium (ATROVENT) nebulizer solution 0.5 mg (0.5 mg Nebulization Given 06/05/2016 1345)  sodium chloride 0.9 % bolus 500 mL (500 mLs Intravenous New Bag/Given 06/03/2016 1352)  methylPREDNISolone sodium succinate (SOLU-MEDROL) 125 mg/2 mL injection 125 mg (125 mg Intravenous Given 06/11/2016 1424)     Initial Impression / Assessment and Plan / ED Course  I have reviewed the triage vital signs and the nursing notes.  Pertinent labs & imaging results that were available during my care of the patient were reviewed by me and considered in my medical decision making (see chart for details).   patient arrived with some difficulty breathing. Oxygen sats were low. The patient bumped up to 4 L of oxygen brought his options sats up into the low 90s. Certainly on room air he sats down below 90. Even on 2 L he was very borderline. Patient stated he was having difficulty breathing. Patient refused to be intubated and go on a ventilator but agreed to BiPAP. Initial thoughts were that this is probably a COPD exacerbation.   Addendum based on the wife's description was been happening over the past year were patient has not or refused to get a shower or bath and has refused to come into the hospital despite EMS coming out several times. And only eats when she feeds him. Has not seen his primary care doctor in a year. However does use his albuterol inhaler. We'll not use other meds.  She noted that today he seemed to be less responsive than usual. EMS was out there this morning he refused to come in. When she came back from doing chores she called them again in this time he agreed she forced him to get into the shower before he came.   Based on all this have extended the workup to include a head CT although patient neurologically seems to be  intact and have ordered a CT of the abdomen although his liver function test and electrolytes do appear normal. Urinalysis still pending.  Arterial blood gas showed the signs consistent with COPD and some CO2 retention. PH was 7.34 PCO2 was 59 and PO2 was 129. This was done shortly after he been on BiPAP for a little bit of time.  Patient on the BiPAP seems to be improving he states his breathing is better. Patient still fairly drowsy but will wake up and will talk.   In addition his heart rate is come down to the 80s from the low 100s. Oxygen saturations much better. Skin color  facial color much better. All signs that the BiPAP is helping.   Lab workup without any real significant findings troponin negative lactic acid negative no leukocytosis. Liver function tests without significant abnormalities electrolytes normal.  Patient clearly will require admission CT head and CT abdomen with just IV contrast are pending.  For the COPD in addition to the BiPAP patient also received nebulizer treatment and started on 1 25 mg solu Medrol.    Final Clinical Impressions(s) / ED Diagnoses   Final diagnoses:  COPD exacerbation (HCC)  Altered mental status, unspecified altered mental status type  Acute on chronic respiratory failure with hypoxia and hypercapnia (HCC)    New Prescriptions New Prescriptions   No medications on file     Delanie Tirrell, MD 05/19/Vanetta Mulders18 [id number]   Addendum:  Patient did go over to CT off of BiPAP maintained his sats in the low 90s. Head CT grossly without any sniffing abnormalities. CT of abdomen still pending. Patient was moving all extremities all over at the scanner. Did not interfere with scan however. But the fact that he was moving everything was reassuring.  Patient clearly will require admission for the COPD exacerbation in the respiratory failure. Patient will be put back on the BiPAP because of the elevated PCO2. Turned over to the evening emergency  physician who will check a scan results and then arrange admission.    Vanetta MuldersZackowski, Reef Achterberg, MD 2016-05-30 [phone]

## 2016-06-07 NOTE — H&P (Signed)
History and Physical    Roger Roger AVW:[id number] DOB: [birth year] DOA: 2016/06/19  PCP: Avis Epley, PA-C   Patient coming from: Home  Chief Complaint: SOB, confusion   HPI: Roger Roger is a 61 y.o. male with medical history significant for tobacco abuse and COPD with chronic respiratory failure requiring 2 L/m of supplemental oxygen at baseline, now presenting to the emergency department for evaluation of respiratory distress and confusion. History is provided by patient's wife, report of EMS, review of the EMR, and from the patient himself. Patient's wife reports that Roger Roger has not seen a physician in at least a year and has not used any of his medications aside from albuterol over that same interval. He has not bathed in a year and will not eat or drink anything and less his wife hands it to him. He has not very active, and does have chronic dyspnea and cough, but had been seemingly stable until today when his wife attempted to bathe him. She describes deciding that she would make him bathe today, but the patient became severely dyspneic and confused when she was trying to bathe him. EMS was called out and found the patient to be saturating 75% on his 2 L/m of supplemental oxygen. Recent drug or alcohol use is denied and the patient denies chest pain or palpitations. He also denied headache, change in vision or hearing, or focal numbness or weakness. He was placed on increased FiO2 and transported to the hospital.  ED Course: Upon arrival to the ED, patient is found to be afebrile, saturating well on BiPAP, tachypneic, mildly tachycardic, and with stable blood pressure. EKG features sinus tachycardia with rate 112, PVCs, and RBBB. Chest x-ray features changes consistent with COPD, but no acute cardiopulmonary disease. Noncontrast head CT is negative for acute intracranial abnormality. CT of abdomen and pelvis feature some nonspecific incidental findings, but no acute intra-abdominal or  pelvic process. Chemistry panel reveals a bicarbonate of 35. CBC is within the normal limits, the urinalysis is unremarkable, lactic acid is reassuring at 1.32, and troponin and BNP are both normal. Ethanol level was undetectable. D-dimer was 0.59. Patient was given 500 mL of normal saline, DuoNeb, and 125 mg IV Solu-Medrol in the ED. His mentation improved considerably after being on BiPAP and he was transitioned to nasal cannula. He became more drowsy shortly after transition to nasal cannula and he was placed back on BiPAP. He has remained hemodynamically stable. He will be admitted to the stepdown unit for ongoing evaluation and management of acute on chronic hypercarbic respiratory failure with acute encephalopathy, likely secondary to acute exacerbation and COPD with CO2 narcosis.  Review of Systems:  All other systems reviewed and apart from HPI, are negative.  Past Medical History:  Diagnosis Date  . Arthritis   . Back pain   . COPD (chronic obstructive pulmonary disease) (HCC)     History reviewed. No pertinent surgical history.   reports that he has been smoking Cigarettes.  He does not have any smokeless tobacco history on file. He reports that he does not drink alcohol or use drugs.  Allergies  Allergen Reactions  . Percocet [Oxycodone-Acetaminophen] Rash and Other (See Comments)    Has cold sweats    History reviewed. No pertinent family history.   Prior to Admission medications   Medication Sig Start Date End Date Taking? Authorizing Provider  budesonide-formoterol (SYMBICORT) 160-4.5 MCG/ACT inhaler Inhale 2 puffs into the lungs 2 (two) times daily. 10/04/14  Yes Black,  Lesle ChrisKaren M, NP  PROAIR HFA 108 (90 BASE) MCG/ACT inhaler Inhale 2 puffs into the lungs every 6 (six) hours as needed. Shortness of breath,cough(rescue inhaler) 08/31/14  Yes [provider]  guaiFENesin-dextromethorphan (ROBITUSSIN DM) 100-10 MG/5ML syrup Take 5 mLs by mouth every 4 (four) hours as  needed for cough. 10/04/14   Black, Lesle ChrisKaren M, NP  SPIRIVA RESPIMAT 2.5 MCG/ACT AERS Inhale 2 puffs into the lungs daily. 09/01/14   [provider]    Physical Exam: Vitals:   2016/07/15 1530 2016/07/15 1600 2016/07/15 1630 2016/07/15 1716  BP: 115/71 121/77 (!) 97/58 (!) 109/94  Pulse: 93 96 82 75  Resp:  15 14 13   Temp:      TempSrc:      SpO2: 100% 99% 99%   Weight:      Height:          Constitutional: Appears chronically-ill, frail. Mild tachypnea. No pallor, no diaphoresis. On BiPAP.  Eyes: PERTLA, lids and conjunctivae normal ENMT: Mucous membranes are moist. Posterior pharynx clear of any exudate or lesions.   Neck: normal, supple, no masses, no thyromegaly Respiratory: Markedly diminished breath sounds bilaterally. End-expiratory wheeze. No accessory muscle use.  Cardiovascular: Rate ~110 and regular. No extremity edema. No significant JVD. Abdomen: No distension, no tenderness, no masses palpated. Bowel sounds normal.  Musculoskeletal: no clubbing / cyanosis. No joint deformity upper and lower extremities.  Skin: no significant rashes, lesions, ulcers. Mottling to bilateral feet and ankles. Neurologic: CN 2-12 grossly intact. Sensation intact, DTR normal. Strength 5/5 in all 4 limbs.  Psychiatric: Somnolent, easily roused. Oriented to person, place, and situation, but not oriented to day or date or month.  Cooperative.     Labs on Admission: I have personally reviewed following labs and imaging studies  CBC:  Recent Labs Lab 2016/07/15 1343 2016/07/15 1403  WBC 7.0  --   NEUTROABS 5.6  --   HGB 13.6 13.3  HCT 40.4 39.0  MCV 88.2  --   PLT 250  --    Basic Metabolic Panel:  Recent Labs Lab 2016/07/15 1343 2016/07/15 1403  NA 140 138  K 3.8 3.7  CL 97* 99*  CO2 35*  --   GLUCOSE 105* 101*  BUN 15 15  CREATININE 0.91 1.00  CALCIUM 9.5  --    GFR: Estimated Creatinine Clearance: 75.2 mL/min (by C-G formula based on SCr of 1 mg/dL). Liver Function  Tests:  Recent Labs Lab 2016/07/15 1343  AST 22  ALT 14*  ALKPHOS 118  BILITOT 0.7  PROT 7.5  ALBUMIN 4.3    Recent Labs Lab 2016/07/15 1343  LIPASE 14   No results for input(s): AMMONIA in the last 168 hours. Coagulation Profile: No results for input(s): INR, PROTIME in the last 168 hours. Cardiac Enzymes: No results for input(s): CKTOTAL, CKMB, CKMBINDEX, TROPONINI in the last 168 hours. BNP (last 3 results) No results for input(s): PROBNP in the last 8760 hours. HbA1C: No results for input(s): HGBA1C in the last 72 hours. CBG: No results for input(s): GLUCAP in the last 168 hours. Lipid Profile: No results for input(s): CHOL, HDL, LDLCALC, TRIG, CHOLHDL, LDLDIRECT in the last 72 hours. Thyroid Function Tests: No results for input(s): TSH, T4TOTAL, FREET4, T3FREE, THYROIDAB in the last 72 hours. Anemia Panel: No results for input(s): VITAMINB12, FOLATE, FERRITIN, TIBC, IRON, RETICCTPCT in the last 72 hours. Urine analysis:    Component Value Date/Time   COLORURINE YELLOW Mar 16, 2016 1344   APPEARANCEUR CLEAR Mar 16, 2016 1344  LABSPEC 1.021 06/06/2016 1344   PHURINE 5.0 06/09/2016 1344   GLUCOSEU NEGATIVE 06/16/2016 1344   HGBUR SMALL (A) 06/04/2016 1344   BILIRUBINUR NEGATIVE 06/17/2016 1344   KETONESUR NEGATIVE 05/23/2016 1344   PROTEINUR NEGATIVE 06/17/2016 1344   UROBILINOGEN 1.0 10/02/2014 0830   NITRITE NEGATIVE 05/26/2016 1344   LEUKOCYTESUR NEGATIVE 06/11/2016 1344   Sepsis Labs: @LABRCNTIP (procalcitonin:4,lacticidven:4) )No results found for this or any previous visit (from the past 240 hour(s)).   Radiological Exams on Admission: Ct Head Wo Contrast  Result Date: 06/18/2016 CLINICAL DATA:  Altered mental status and hypoxia. Lethargic. Loss of appetite 1 year. Difficulty breathing today. EXAM: CT HEAD WITHOUT CONTRAST TECHNIQUE: Contiguous axial images were obtained from the base of the skull through the vertex without intravenous contrast. COMPARISON:   None. FINDINGS: Brain: Ventricles, cisterns and other CSF spaces are within normal. There is no mass, mass effect, shift of midline structures or acute hemorrhage. No evidence to suggest acute infarction. Mild chronic ischemic microvascular disease. Vascular: No hyperdense vessel or unexpected calcification. Skull: Within normal. Sinuses/Orbits: Orbits are normal. Mild mucosal membrane thickening over the left frontal sinus and frontoethmoidal recess as well as mild opacification over the ethmoid air cells and right maxillary sinus. Mastoid air cells are clear. Other: None. IMPRESSION: No acute intracranial findings. Mild chronic ischemic microvascular disease. Mild chronic sinus inflammatory change. Electronically Signed   By: Elberta Fortis M.D.   On: 06/03/2016 16:00   Ct Abdomen Pelvis W Contrast  Result Date: 05/24/2016 CLINICAL DATA:  The patient is lethargic with abdominal pain and loss of appetite. EXAM: CT ABDOMEN AND PELVIS WITH CONTRAST TECHNIQUE: Multidetector CT imaging of the abdomen and pelvis was performed using the standard protocol following bolus administration of intravenous contrast. CONTRAST:  ISOVUE-300 IOPAMIDOL (ISOVUE-300) INJECTION 61% COMPARISON:  None. FINDINGS: Lower chest: The descending thoracic aorta measures up to 3.9 cm on series 2, image 1. The lung bases are otherwise unremarkable. Hepatobiliary: Cholelithiasis is identified in a mildly distended gallbladder. A probable tiny cyst is seen in the left hepatic lobe on axial image 11. This is too small to completely characterize. The liver and portal vein are otherwise normal. Pancreas: Unremarkable. No pancreatic ductal dilatation or surrounding inflammatory changes. Spleen: Normal in size without focal abnormality. Adrenals/Urinary Tract: There is a cyst in the right kidney. 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 The lower pole moiety is atrophic. An extrarenal pelvis associated with the lower  pole moiety is mildly prominent. No hydronephrosis. The distal right ureter is somewhat thickened as seen on coronal image 75. High attenuation foci just lateral to the right UVJ likely represent tiny previously passed stones. There is a small diverticulum associated with the lateral right bladder containing a high attenuation focus as seen on coronal image 70. The ureters and bladder are otherwise unremarkable. Stomach/Bowel: The stomach and small bowel are normal. There is a rounded defect along the left lateral wall of the rectum as seen on coronal image 81 and axial image 62 measuring 1.5 cm. Scattered colonic diverticuli are seen without diverticulitis. Remainder of the colon is unremarkable. The appendix is unremarkable as well. Vascular/Lymphatic: The proximal abdominal aorta measures 3.65 cm on series 2, image 8. There is atherosclerosis throughout the aorta. No dissection. The remainder of the aorta is normal in caliber. No adenopathy identified. Reproductive: Prostate is unremarkable. Other: No free air or free fluid. Evaluation of the upper to mid abdomen is limited by motion. Musculoskeletal: No acute  or significant osseous findings. IMPRESSION: 1. Evaluation of the upper abdomen is limited due to respiratory motion. 2. 15 mm filling defect in the left lateral aspect of the rectum. This could represent adherent stool but a polyp or mass is not excluded. Recommend direct visualization. 3. The distal right ureter appears thickened, best seen on coronal images. This is an age-indeterminate finding of uncertain etiology. There are a few tiny stones in the bladder, just lateral to the UVJ which are consistent with previously passed stones of uncertain chronicity. There is a bladder diverticulum containing a tiny stone. 4. Multiple stones are seen in the mildly distended gallbladder with no wall thickening. An ultrasound could better evaluate the gallbladder. 5. The descending thoracic aorta measures 3.9 cm  and the proximal abdominal aortic measures 3.65 cm. Recommend followup by ultrasound in 2 years. This recommendation follows ACR consensus guidelines: White Paper of the ACR Incidental Findings Committee II on Vascular Findings. J Am Coll Radiol 2013; 10:789-794. Electronically Signed   By: Gerome Sam III M.D   On: 07-01-2016 16:14   Dg Chest Portable 1 View  Result Date: Jul 01, 2016 CLINICAL DATA:  Shortness of breath and altered mental status. EXAM: PORTABLE CHEST 1 VIEW COMPARISON:  09/21/2014 FINDINGS: Lungs are hyperexpanded without focal consolidation or effusion. Mild stable tenting of the left hemidiaphragm. Cardiac silhouette is within normal. Minimal calcification and tortuosity of the thoracic aorta. Remainder of the exam is unchanged. IMPRESSION: No acute cardiopulmonary disease. COPD. Aortic atherosclerosis. Electronically Signed   By: Elberta Fortis M.D.   On: 01-Jul-2016 14:02    EKG: Independently reviewed. Sinus tachycardia (rate 112), PVC's, RBBB.   Assessment/Plan  1. COPD with acute exacerbation; acute on chronic hypercarbic respiratory failure  - Pt with chronic COPD, requiring 2 Lpm at baseline, presents with acute dyspnea on exertion and confusion  - Found to have dusky skin coloration initially and saturating in 70's on 2 Lpm with obstructive breathing; pCO2 of 60 on BiPAP - He has improved coloration and mentation with continued BiPAP; he was also treated with DuoNeb and 125 mg IV Solu-Medrol in ED   - CXR clear, no leukocytosis or fever, no leg swelling or tenderness, tachycardia resolved with IVF - Plan to continue BiPAP initially, continue nebs, check sputum culture, continue systemic steroid, and add azithromycin   2. Acute encephalopathy  - Noted by wife to be confused PTA, improved en route with increased FiO2  - Likely secondary to CO2 narcosis given the clinical scenario; head CT negative for acute intracranial abnormality and there are no focal neurologic  deficits identified  - He is somnolent on admission, but easily woken and answers questions appropriately  - Anticipate resolution with continued treatment of #1, will monitor   3. Failure to thrive  - Pt's wife notes that he has not seen his doctor, taking his medicines aside from albuterol, or bathed in the last year; he will only eat or drink if she puts it in his hand - Unclear if there is depression or early dementia contributing; difficult to assess given current somnolence secondary to hypercarbia, will continue to monitor  - SW and PT consulted   4. Incidental findings on CT abd/pelvis  - Rectal defect noted, possibly polyp, direct eval advised  - Ascending and abdominal aortic dilations to 3.9 and 3.65 cm, respectively, with abd [place] advised in 2 yrs      DVT prophylaxis: sq Lovenox  Code Status: Limited, no intubation  Family Communication: Discussed with patient  Disposition Plan: Admit to SDU Consults called: None Admission status: Inpatient    Briscoe Deutscher, MD [facility] Pager [phone]  If 7PM-7AM, please contact night-coverage [email or web address] Password [facility]  2016/06/09, 5:24 PM

## 2016-06-08 ENCOUNTER — Encounter (HOSPITAL_COMMUNITY): Payer: Self-pay

## 2016-06-08 DIAGNOSIS — I719 Aortic aneurysm of unspecified site, without rupture: Secondary | ICD-10-CM

## 2016-06-08 LAB — BASIC METABOLIC PANEL
ANION GAP: 7 (ref 5–15)
BUN: 12 mg/dL (ref 6–20)
CO2: 31 mmol/L (ref 22–32)
Calcium: 8.8 mg/dL — ABNORMAL LOW (ref 8.9–10.3)
Chloride: 100 mmol/L — ABNORMAL LOW (ref 101–111)
Creatinine, Ser: 0.85 mg/dL (ref 0.61–1.24)
GFR calc non Af Amer: >60 mL/min (ref >60)
Glucose, Bld: 162 mg/dL — ABNORMAL HIGH (ref 65–99)
Potassium: 4.9 mmol/L (ref 3.5–5.1)
Sodium: 138 mmol/L (ref 135–145)

## 2016-06-08 LAB — CBC
HCT: 35.9 % — ABNORMAL LOW (ref 39.0–52.0)
HEMOGLOBIN: 12.2 g/dL — AB (ref 13.0–17.0)
MCH: 29.6 pg (ref 26.0–34.0)
MCHC: 34 g/dL (ref 30.0–36.0)
MCV: 87.1 fL (ref 78.0–100.0)
Platelets: 212 10*3/uL (ref 150–400)
RBC: 4.12 MIL/uL — AB (ref 4.22–5.81)
RDW: 13.2 % (ref 11.5–15.5)
WBC: 3.5 10*3/uL — ABNORMAL LOW (ref 4.0–10.5)

## 2016-06-08 LAB — RAPID URINE DRUG SCREEN, HOSP PERFORMED
Amphetamines: NOT DETECTED
Barbiturates: NOT DETECTED
Benzodiazepines: NOT DETECTED
Cocaine: NOT DETECTED
OPIATES: NOT DETECTED
Tetrahydrocannabinol: NOT DETECTED

## 2016-06-08 MED ORDER — MORPHINE SULFATE (PF) 2 MG/ML IV SOLN
1.0000 mg | INTRAVENOUS | Status: DC | PRN
  Administered 2016-06-08 – 2016-06-10 (×10): 1 mg via INTRAVENOUS
  Filled 2016-06-08 (×10): qty 1

## 2016-06-08 NOTE — Progress Notes (Signed)
PROGRESS NOTE    Roger DarterSteve Danish  WUJ:[id number] DOB: Dec 11, 1955 DOA: 06/16/2016 PCP: Avis EpleyJackson, Roger J, PA-C    Brief Narrative:  61 year old male with a history of oxygen dependent/steroid dependent COPD presents here with worsening shortness of breath and confusion. Found to have elevated PCO2 and COPD exacerbation. Admitted to the stepdown unit on BiPAP. Pulmonology following. Palliative care also consulted for goals of care.   Assessment & Plan:   Principal Problem:   COPD with acute exacerbation (HCC) Active Problems:   Acute on chronic respiratory failure with hypercapnia (HCC)   Acute encephalopathy   Failure to thrive in adult   Acute on chronic respiratory failure with hypoxia and hypercapnia (HCC)   Descending aortic aneurysm (HCC)  1. Acute on chronic respiratory failure with hypoxia and hypercarbia. Related to COPD exacerbation. Currently on BiPAP. Appreciate pulmonology assistance. Continue current treatments. He is chronically on 2 L. 2. Steroid-dependent, oxygen dependent COPD exacerbation. Started on intravenous steroids, bronchodilators and antibiotics. He has very advanced COPD at baseline. Continue current treatments. 3. Acute encephalopathy. Secondary to CO2 narcosis. Mild improvement after starting on BiPAP. He is still somnolent. Continue current treatments. 4. Failure to thrive. Suspect this is related to advanced COPD . 5. Incidental finding on CT abdomen/pelvis of rectal defect. Further evaluation as an outpatient if stabilizes. 6. Descending aortic aneurysm. To be followed as an outpatient. 7. Discussion. Had an honest discussion with the patient's wife regarding the patient's poor state of health and overall prognosis. She confirmed that he has been declining for the past year. She agrees that his COPD is quite advanced. He has poor quality of life. He ambulates minimally at home and has been losing weight due to poor by mouth intake. She reports that he has had  several times that he is "ready to die". We'll consult palliative care to further discuss goals of care. Patient would be a very good candidate for hospice. In the event of cardiac arrest, she would not want the patient undergo CPR. In the event of an isolated respiratory arrest, she would want him to be intubated for now until consensus with rest or family can be obtained regarding his goals of care.   DVT prophylaxis: lovenox Code Status: limited code, no cpr or defibrillation Family Communication: discussed with wife at the bedside Disposition Plan: pending hospital course   Consultants:   pulmonology  Procedures:     Antimicrobials:   Azithromycin 5/19>>   Subjective: Somnolent, wakes up to voice. Feels breathing is unchanged from yesterday  Objective: Vitals:   06/08/16 1100 06/08/16 1200 06/08/16 1409 06/08/16 1410  BP: 114/75 138/78  139/75  Pulse: (!) 102 (!) 119  (!) 113  Resp: 15 (!) 25  (!) 22  Temp:      TempSrc:      SpO2: 99% 97% 98% 98%  Weight:      Height:        Intake/Output Summary (Last 24 hours) at 06/08/16 1413 Last data filed at 06/08/16 0600  Gross per 24 hour  Intake          1176.25 ml  Output             1200 ml  Net           -23.75 ml   Filed Weights   06/19/2016 1322 06/11/2016 1811 06/08/16 0500  Weight: 69.4 kg (153 lb) 64.9 kg (143 lb 1.3 oz) 66.5 kg (146 lb 9.7 oz)    Examination:  General  exam: somnolent, wakes up to voice, on bipap mask Respiratory system: diminished breath sounds bilaterally. Respiratory effort normal. Cardiovascular system: S1 & S2 heard, tachycardic. No JVD, murmurs, rubs, gallops or clicks. No pedal edema. Gastrointestinal system: Abdomen is nondistended, soft and nontender. No organomegaly or masses felt. Normal bowel sounds heard. Central nervous system: No focal neurological deficits. Extremities: Symmetric Skin: skin on feet appears to be mottling bilaterally pulses intact Psychiatry:  somnolent    Data Reviewed: I have personally reviewed following labs and imaging studies  CBC:  Recent Labs Lab 2016/06/28 1343 06-28-2016 1403 06/08/16 0452  WBC 7.0  --  3.5*  NEUTROABS 5.6  --   --   HGB 13.6 13.3 12.2*  HCT 40.4 39.0 35.9*  MCV 88.2  --  87.1  PLT 250  --  212   Basic Metabolic Panel:  Recent Labs Lab 2016-06-28 1343 28-Jun-2016 1403 06/08/16 0452  NA 140 138 138  K 3.8 3.7 4.9  CL 97* 99* 100*  CO2 35*  --  31  GLUCOSE 105* 101* 162*  BUN 15 15 12   CREATININE 0.91 1.00 0.85  CALCIUM 9.5  --  8.8*   GFR: Estimated Creatinine Clearance: 84.8 mL/min (by C-G formula based on SCr of 0.85 mg/dL). Liver Function Tests:  Recent Labs Lab 06-28-2016 1343  AST 22  ALT 14*  ALKPHOS 118  BILITOT 0.7  PROT 7.5  ALBUMIN 4.3    Recent Labs Lab 28-Jun-2016 1343  LIPASE 14   No results for input(s): AMMONIA in the last 168 hours. Coagulation Profile: No results for input(s): INR, PROTIME in the last 168 hours. Cardiac Enzymes: No results for input(s): CKTOTAL, CKMB, CKMBINDEX, TROPONINI in the last 168 hours. BNP (last 3 results) No results for input(s): PROBNP in the last 8760 hours. HbA1C: No results for input(s): HGBA1C in the last 72 hours. CBG: No results for input(s): GLUCAP in the last 168 hours. Lipid Profile: No results for input(s): CHOL, HDL, LDLCALC, TRIG, CHOLHDL, LDLDIRECT in the last 72 hours. Thyroid Function Tests:  Recent Labs  06/28/2016 1344  TSH 0.559   Anemia Panel: No results for input(s): VITAMINB12, FOLATE, FERRITIN, TIBC, IRON, RETICCTPCT in the last 72 hours. Sepsis Labs:  Recent Labs Lab 06-28-2016 1355  LATICACIDVEN 1.32    Recent Results (from the past 240 hour(s))  MRSA PCR Screening     Status: None   Collection Time: 06-28-16  6:00 PM  Result Value Ref Range Status   MRSA by PCR NEGATIVE NEGATIVE Final    Comment:        The GeneXpert MRSA Assay (FDA approved for NASAL specimens only), is one component  of a comprehensive MRSA colonization surveillance program. It is not intended to diagnose MRSA infection nor to guide or monitor treatment for MRSA infections.          Radiology Studies: Ct Head Wo Contrast  Result Date: June 28, 2016 CLINICAL DATA:  Altered mental status and hypoxia. Lethargic. Loss of appetite 1 year. Difficulty breathing today. EXAM: CT HEAD WITHOUT CONTRAST TECHNIQUE: Contiguous axial images were obtained from the base of the skull through the vertex without intravenous contrast. COMPARISON:  None. FINDINGS: Brain: Ventricles, cisterns and other CSF spaces are within normal. There is no mass, mass effect, shift of midline structures or acute hemorrhage. No evidence to suggest acute infarction. Mild chronic ischemic microvascular disease. Vascular: No hyperdense vessel or unexpected calcification. Skull: Within normal. Sinuses/Orbits: Orbits are normal. Mild mucosal membrane thickening over the left frontal  sinus and frontoethmoidal recess as well as mild opacification over the ethmoid air cells and right maxillary sinus. Mastoid air cells are clear. Other: None. IMPRESSION: No acute intracranial findings. Mild chronic ischemic microvascular disease. Mild chronic sinus inflammatory change. Electronically Signed   By: Elberta Fortis M.D.   On: 06-24-2016 16:00   Ct Abdomen Pelvis W Contrast  Result Date: Jun 24, 2016 CLINICAL DATA:  The patient is lethargic with abdominal pain and loss of appetite. EXAM: CT ABDOMEN AND PELVIS WITH CONTRAST TECHNIQUE: Multidetector CT imaging of the abdomen and pelvis was performed using the standard protocol following bolus administration of intravenous contrast. CONTRAST:  ISOVUE-300 IOPAMIDOL (ISOVUE-300) INJECTION 61% COMPARISON:  None. FINDINGS: Lower chest: The descending thoracic aorta measures up to 3.9 cm on series 2, image 1. The lung bases are otherwise unremarkable. Hepatobiliary: Cholelithiasis is identified in a mildly distended  gallbladder. A probable tiny cyst is seen in the left hepatic lobe on axial image 11. This is too small to completely characterize. The liver and portal vein are otherwise normal. Pancreas: Unremarkable. No pancreatic ductal dilatation or surrounding inflammatory changes. Spleen: Normal in size without focal abnormality. Adrenals/Urinary Tract: There is a cyst in the right kidney. 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 The lower pole moiety is atrophic. An extrarenal pelvis associated with the lower pole moiety is mildly prominent. No hydronephrosis. The distal right ureter is somewhat thickened as seen on coronal image 75. High attenuation foci just lateral to the right UVJ likely represent tiny previously passed stones. There is a small diverticulum associated with the lateral right bladder containing a high attenuation focus as seen on coronal image 70. The ureters and bladder are otherwise unremarkable. Stomach/Bowel: The stomach and small bowel are normal. There is a rounded defect along the left lateral wall of the rectum as seen on coronal image 81 and axial image 62 measuring 1.5 cm. Scattered colonic diverticuli are seen without diverticulitis. Remainder of the colon is unremarkable. The appendix is unremarkable as well. Vascular/Lymphatic: The proximal abdominal aorta measures 3.65 cm on series 2, image 8. There is atherosclerosis throughout the aorta. No dissection. The remainder of the aorta is normal in caliber. No adenopathy identified. Reproductive: Prostate is unremarkable. Other: No free air or free fluid. Evaluation of the upper to mid abdomen is limited by motion. Musculoskeletal: No acute or significant osseous findings. IMPRESSION: 1. Evaluation of the upper abdomen is limited due to respiratory motion. 2. 15 mm filling defect in the left lateral aspect of the rectum. This could represent adherent stool but a polyp or mass is not excluded. Recommend direct  visualization. 3. The distal right ureter appears thickened, best seen on coronal images. This is an age-indeterminate finding of uncertain etiology. There are a few tiny stones in the bladder, just lateral to the UVJ which are consistent with previously passed stones of uncertain chronicity. There is a bladder diverticulum containing a tiny stone. 4. Multiple stones are seen in the mildly distended gallbladder with no wall thickening. An ultrasound could better evaluate the gallbladder. 5. The descending thoracic aorta measures 3.9 cm and the proximal abdominal aortic measures 3.65 cm. Recommend followup by ultrasound in 2 years. This recommendation follows ACR consensus guidelines: White Paper of the ACR Incidental Findings Committee II on Vascular Findings. [employer] Am Coll Radiol 2013; 10:789-794. Electronically Signed   By: Gerome Sam III M.D   On: Jun 24, 2016 16:14   Dg Chest Portable 1 View  Result Date:  06-27-16 CLINICAL DATA:  Shortness of breath and altered mental status. EXAM: PORTABLE CHEST 1 VIEW COMPARISON:  09/21/2014 FINDINGS: Lungs are hyperexpanded without focal consolidation or effusion. Mild stable tenting of the left hemidiaphragm. Cardiac silhouette is within normal. Minimal calcification and tortuosity of the thoracic aorta. Remainder of the exam is unchanged. IMPRESSION: No acute cardiopulmonary disease. COPD. Aortic atherosclerosis. Electronically Signed   By: Elberta Fortis M.D.   On: Jun 27, 2016 14:02        Scheduled Meds: . enoxaparin (LOVENOX) injection  40 mg Subcutaneous Q24H  . famotidine  20 mg Oral Daily  . ipratropium-albuterol  3 mL Nebulization Q6H  . methylPREDNISolone (SOLU-MEDROL) injection  60 mg Intravenous Q6H  . mometasone-formoterol  2 puff Inhalation BID  . sodium chloride flush  3 mL Intravenous Q12H   Continuous Infusions: . azithromycin Stopped (06-27-2016 2117)     LOS: 1 day    Critical care Time spent:    Erick Blinks, MD [facility] Pager [phone]  If 7PM-7AM, please contact night-coverage [email or web address] Password [email or web address] 06/08/2016, 2:13 PM

## 2016-06-08 NOTE — Consult Note (Signed)
Consult requested by: [facility] Consult requested for: Acute on chronic respiratory failure  HPI: This is a 61 year old who has severe COPD with chronic respiratory failure requiring supplemental oxygen at home. He came to the emergency department because he was having increasing respiratory distress and confusion. History is from his wife who is at bedside because he is on BiPAP and not very responsive. He has apparently not seen a physician or another provider in at least a year and he's not used any of his normal medications except for albuterol for at least a year. He has not bathed in a year. He doesn't feed himself unless his wife hands it to him. He was found to have O2 saturation of about 75% on 2 L and was brought to the hospital. He apparently initially improved with BiPAP but he was much worse this morning. His wife understands the severity of his illness and probability that he will not survive this episode. He's not had chest pain fever chills hemoptysis night sweats per his wife. Positive family history for COPD  Past Medical History:  Diagnosis Date  . Arthritis   . Back pain   . COPD (chronic obstructive pulmonary disease) (HCC)      History reviewed. No pertinent family history.   Social History   Social History  . Marital status: Married    Spouse name: N/A  . Number of children: N/A  . Years of education: N/A   Social History Main Topics  . Smoking status: Current Every Day Smoker    Types: Cigarettes  . Smokeless tobacco: None  . Alcohol use No  . Drug use: No  . Sexual activity: Not Asked   Other Topics Concern  . None   Social History Narrative  . None     ROS: Unobtainable    Objective: Vital signs in last 24 hours: Temp:  [97.7 F (36.5 C)-98.1 F (36.7 C)] 97.8 F (36.6 C) (05/20 0400) Pulse Rate:  [70-109] 83 (05/20 0800) Resp:  [12-24] 19 (05/20 0800) BP: (97-197)/(54-101) 150/77 (05/20 0800) SpO2:  [93 %-100 %] 95 % (05/20  0831) FiO2 (%):  [35 %-45 %] 35 % (05/20 0600) Weight:  [64.9 kg (143 lb 1.3 oz)-69.4 kg (153 lb)] 66.5 kg (146 lb 9.7 oz) (05/20 0500) Weight change:  Last BM Date: 06/13/2016  Intake/Output from previous day: 05/19 0701 - 05/20 0700 In: 1176.3 [I.V.:926.3; IV Piggyback:250] Out: 1200 [Urine:1200]  PHYSICAL EXAM Constitutional: He is on BiPAP and poorly responsive now. Eyes: Pupils reactive ears nose mouth and throat: Limited exam is unremarkable cardiovascular: His heart is regular with normal heart sounds. Respiratory: His respiratory effort is increased and he is on BiPAP. He has markedly diminished breath sounds bilaterally with prolonged expiratory phase gastrointestinal: His abdomen is soft. Bowel sounds are present and active. Skin: Thin and easily tents musculoskeletal: He's not able to cooperate to check his strength. Neurological: No focal abnormalities psychiatric: Unable to assess  Lab Results: Basic Metabolic Panel:  Recent Labs  16/10/9603/28/2018 1343 05/31/2016 1403 06/08/16 0452  NA 140 138 138  K 3.8 3.7 4.9  CL 97* 99* 100*  CO2 35*  --  31  GLUCOSE 105* 101* 162*  BUN 15 15 12   CREATININE 0.91 1.00 0.85  CALCIUM 9.5  --  8.8*   Liver Function Tests:  Recent Labs  05/23/2016 1343  AST 22  ALT 14*  ALKPHOS 118  BILITOT 0.7  PROT 7.5  ALBUMIN 4.3    Recent Labs  05/30/2016 1343  LIPASE 14   No results for input(s): AMMONIA in the last 72 hours. CBC:  Recent Labs  05/21/2016 1343 06/01/2016 1403 06/08/16 0452  WBC 7.0  --  3.5*  NEUTROABS 5.6  --   --   HGB 13.6 13.3 12.2*  HCT 40.4 39.0 35.9*  MCV 88.2  --  87.1  PLT 250  --  212   Cardiac Enzymes: No results for input(s): CKTOTAL, CKMB, CKMBINDEX, TROPONINI in the last 72 hours. BNP: No results for input(s): PROBNP in the last 72 hours. D-Dimer:  Recent Labs  05/27/2016 1344  DDIMER 0.59*   CBG: No results for input(s): GLUCAP in the last 72 hours. Hemoglobin A1C: No results for input(s):  HGBA1C in the last 72 hours. Fasting Lipid Panel: No results for input(s): CHOL, HDL, LDLCALC, TRIG, CHOLHDL, LDLDIRECT in the last 72 hours. Thyroid Function Tests:  Recent Labs  05/31/2016 1344  TSH 0.559   Anemia Panel: No results for input(s): VITAMINB12, FOLATE, FERRITIN, TIBC, IRON, RETICCTPCT in the last 72 hours. Coagulation: No results for input(s): LABPROT, INR in the last 72 hours. Urine Drug Screen: Drugs of Abuse     Component Value Date/Time   LABOPIA NONE DETECTED 05/20/2016 2340   COCAINSCRNUR NONE DETECTED 06/10/2016 2340   LABBENZ NONE DETECTED 06/08/2016 2340   AMPHETMU NONE DETECTED 06/17/2016 2340   THCU NONE DETECTED 05/27/2016 2340   LABBARB NONE DETECTED 05/30/2016 2340    Alcohol Level:  Recent Labs  06/02/2016 1343  ETH <5   Urinalysis:  Recent Labs  05/26/2016 1344  COLORURINE YELLOW  LABSPEC 1.021  PHURINE 5.0  GLUCOSEU NEGATIVE  HGBUR SMALL*  BILIRUBINUR NEGATIVE  KETONESUR NEGATIVE  PROTEINUR NEGATIVE  NITRITE NEGATIVE  LEUKOCYTESUR NEGATIVE   Misc. Labs:   ABGS:  Recent Labs  06/08/2016 1400 06/05/2016 1403  PHART 7.348*  --   PO2ART 129*  --   TCO2  --  32  HCO3 29.0*  --      MICROBIOLOGY: Recent Results (from the past 240 hour(s))  MRSA PCR Screening     Status: None   Collection Time: 05/24/2016  6:00 PM  Result Value Ref Range Status   MRSA by PCR NEGATIVE NEGATIVE Final    Comment:        The GeneXpert MRSA Assay (FDA approved for NASAL specimens only), is one component of a comprehensive MRSA colonization surveillance program. It is not intended to diagnose MRSA infection nor to guide or monitor treatment for MRSA infections.     Studies/Results: Ct Head Wo Contrast  Result Date: 06/03/2016 CLINICAL DATA:  Altered mental status and hypoxia. Lethargic. Loss of appetite 1 year. Difficulty breathing today. EXAM: CT HEAD WITHOUT CONTRAST TECHNIQUE: Contiguous axial images were obtained from the base of the  skull through the vertex without intravenous contrast. COMPARISON:  None. FINDINGS: Brain: Ventricles, cisterns and other CSF spaces are within normal. There is no mass, mass effect, shift of midline structures or acute hemorrhage. No evidence to suggest acute infarction. Mild chronic ischemic microvascular disease. Vascular: No hyperdense vessel or unexpected calcification. Skull: Within normal. Sinuses/Orbits: Orbits are normal. Mild mucosal membrane thickening over the left frontal sinus and frontoethmoidal recess as well as mild opacification over the ethmoid air cells and right maxillary sinus. Mastoid air cells are clear. Other: None. IMPRESSION: No acute intracranial findings. Mild chronic ischemic microvascular disease. Mild chronic sinus inflammatory change. Electronically Signed   By: Elberta Fortis M.D.   On: 06/09/2016 16:00  Ct Abdomen Pelvis W Contrast  Result Date: 06/04/2016 CLINICAL DATA:  The patient is lethargic with abdominal pain and loss of appetite. EXAM: CT ABDOMEN AND PELVIS WITH CONTRAST TECHNIQUE: Multidetector CT imaging of the abdomen and pelvis was performed using the standard protocol following bolus administration of intravenous contrast. CONTRAST:  ISOVUE-300 IOPAMIDOL (ISOVUE-300) INJECTION 61% COMPARISON:  None. FINDINGS: Lower chest: The descending thoracic aorta measures up to 3.9 cm on series 2, image 1. The lung bases are otherwise unremarkable. Hepatobiliary: Cholelithiasis is identified in a mildly distended gallbladder. A probable tiny cyst is seen in the left hepatic lobe on axial image 11. This is too small to completely characterize. The liver and portal vein are otherwise normal. Pancreas: Unremarkable. No pancreatic ductal dilatation or surrounding inflammatory changes. Spleen: Normal in size without focal abnormality. Adrenals/Urinary Tract: There is a cyst in the right kidney. 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 The lower pole  moiety is atrophic. An extrarenal pelvis associated with the lower pole moiety is mildly prominent. No hydronephrosis. The distal right ureter is somewhat thickened as seen on coronal image 75. High attenuation foci just lateral to the right UVJ likely represent tiny previously passed stones. There is a small diverticulum associated with the lateral right bladder containing a high attenuation focus as seen on coronal image 70. The ureters and bladder are otherwise unremarkable. Stomach/Bowel: The stomach and small bowel are normal. There is a rounded defect along the left lateral wall of the rectum as seen on coronal image 81 and axial image 62 measuring 1.5 cm. Scattered colonic diverticuli are seen without diverticulitis. Remainder of the colon is unremarkable. The appendix is unremarkable as well. Vascular/Lymphatic: The proximal abdominal aorta measures 3.65 cm on series 2, image 8. There is atherosclerosis throughout the aorta. No dissection. The remainder of the aorta is normal in caliber. No adenopathy identified. Reproductive: Prostate is unremarkable. Other: No free air or free fluid. Evaluation of the upper to mid abdomen is limited by motion. Musculoskeletal: No acute or significant osseous findings. IMPRESSION: 1. Evaluation of the upper abdomen is limited due to respiratory motion. 2. 15 mm filling defect in the left lateral aspect of the rectum. This could represent adherent stool but a polyp or mass is not excluded. Recommend direct visualization. 3. The distal right ureter appears thickened, best seen on coronal images. This is an age-indeterminate finding of uncertain etiology. There are a few tiny stones in the bladder, just lateral to the UVJ which are consistent with previously passed stones of uncertain chronicity. There is a bladder diverticulum containing a tiny stone. 4. Multiple stones are seen in the mildly distended gallbladder with no wall thickening. An ultrasound could better evaluate  the gallbladder. 5. The descending thoracic aorta measures 3.9 cm and the proximal abdominal aortic measures 3.65 cm. Recommend followup by ultrasound in 2 years. This recommendation follows ACR consensus guidelines: White Paper of the ACR Incidental Findings Committee II on Vascular Findings. J Am Coll Radiol 2013; 10:789-794. Electronically Signed   By: Gerome Sam III M.D   On: 06/11/2016 16:14   Dg Chest Portable 1 View  Result Date: 05/29/2016 CLINICAL DATA:  Shortness of breath and altered mental status. EXAM: PORTABLE CHEST 1 VIEW COMPARISON:  09/21/2014 FINDINGS: Lungs are hyperexpanded without focal consolidation or effusion. Mild stable tenting of the left hemidiaphragm. Cardiac silhouette is within normal. Minimal calcification and tortuosity of the thoracic aorta. Remainder of the exam is unchanged. IMPRESSION: No  acute cardiopulmonary disease. COPD. Aortic atherosclerosis. Electronically Signed   By: Elberta Fortis M.D.   On: 15-Jun-2016 14:02    Medications:  Prior to Admission:  Prescriptions Prior to Admission  Medication Sig Dispense Refill Last Dose  . budesonide-formoterol (SYMBICORT) 160-4.5 MCG/ACT inhaler Inhale 2 puffs into the lungs 2 (two) times daily. 1 Inhaler 12 06/15/2016 at Unknown time  . PROAIR HFA 108 (90 BASE) MCG/ACT inhaler Inhale 2 puffs into the lungs every 6 (six) hours as needed. Shortness of breath,cough(rescue inhaler)  1 06/15/2016 at Unknown time   Scheduled: . enoxaparin (LOVENOX) injection  40 mg Subcutaneous Q24H  . famotidine  20 mg Oral Daily  . ipratropium-albuterol  3 mL Nebulization Q6H  . methylPREDNISolone (SOLU-MEDROL) injection  60 mg Intravenous Q6H  . mometasone-formoterol  2 puff Inhalation BID  . sodium chloride flush  3 mL Intravenous Q12H   Continuous: . azithromycin Stopped (06-15-16 2117)   WUJ:[id number], guaiFENesin-dextromethorphan, hydrALAZINE, ibuprofen, ipratropium-albuterol, ondansetron **OR** ondansetron (ZOFRAN) IV,  polyethylene glycol  Assesment: He has acute on chronic hypoxic and hypercapnic respiratory failure. He has acute encephalopathy from that. He has failure to thrive. He has COPD exacerbation Principal Problem:   COPD with acute exacerbation (HCC) Active Problems:   Acute on chronic respiratory failure with hypercapnia (HCC)   Acute encephalopathy   Failure to thrive in adult   Acute on chronic respiratory failure with hypoxia and hypercapnia (HCC)   Descending aortic aneurysm (HCC)    Plan: He is on appropriate therapy. Palliative care consultation might be helpful. His wife wants to see how he does over the next 24 hours. She does want him comfortable so I went ahead and ordered morphine. I would continue all the other treatments. Thanks for allowing me to see him with you    LOS: 1 day   Anani Gu L 06/08/2016, 10:40 AM

## 2016-06-09 ENCOUNTER — Encounter (HOSPITAL_COMMUNITY): Payer: Self-pay

## 2016-06-09 DIAGNOSIS — E43 Unspecified severe protein-calorie malnutrition: Secondary | ICD-10-CM | POA: Insufficient documentation

## 2016-06-09 DIAGNOSIS — Z515 Encounter for palliative care: Secondary | ICD-10-CM

## 2016-06-09 DIAGNOSIS — Z7189 Other specified counseling: Secondary | ICD-10-CM

## 2016-06-09 LAB — BASIC METABOLIC PANEL
Anion gap: 5 (ref 5–15)
BUN: 16 mg/dL (ref 6–20)
CALCIUM: 9 mg/dL (ref 8.9–10.3)
CO2: 35 mmol/L — ABNORMAL HIGH (ref 22–32)
Chloride: 99 mmol/L — ABNORMAL LOW (ref 101–111)
Creatinine, Ser: 0.9 mg/dL (ref 0.61–1.24)
GFR calc Af Amer: >60 mL/min (ref >60)
GLUCOSE: 151 mg/dL — AB (ref 65–99)
POTASSIUM: 4.7 mmol/L (ref 3.5–5.1)
SODIUM: 139 mmol/L (ref 135–145)

## 2016-06-09 LAB — GLUCOSE, CAPILLARY: GLUCOSE-CAPILLARY: 126 mg/dL — AB (ref 65–99)

## 2016-06-09 LAB — HIV ANTIBODY (ROUTINE TESTING W REFLEX): HIV Screen 4th Generation wRfx: NONREACTIVE

## 2016-06-09 MED ORDER — ADULT MULTIVITAMIN W/MINERALS CH
1.0000 | ORAL_TABLET | Freq: Every day | ORAL | Status: DC
  Administered 2016-06-10: 1 via ORAL
  Filled 2016-06-09: qty 1

## 2016-06-09 MED ORDER — ENSURE ENLIVE PO LIQD
237.0000 mL | Freq: Two times a day (BID) | ORAL | Status: DC
  Administered 2016-06-10 (×2): 237 mL via ORAL

## 2016-06-09 NOTE — Consult Note (Signed)
Consultation Note Date: 06/09/2016   Patient Name: Roger Arnold  DOB: [birth year]  MRN: [id number]  Age / Sex: 61 y.o., male  PCP: Roger EpleyJackson, Samantha J, PA-C Referring Physician: Erick BlinksMemon, Jehanzeb, MD  Reason for Consultation: Establishing goals of care, Hospice Evaluation and Psychosocial/spiritual support  HPI/Patient Profile: 61 y.o. male  with past medical history of Crippling arthritis, back pain, end-stage COPD admitted on 06/13/2016 with COPD acute exacerbation with respiratory failure.   Clinical Assessment and Goals of Care: Roger Arnold is resting quietly in bed. He makes eye contact but is unable to speak due to his respiratory status. Present at bedside is wife, Roger Arnold. She asks if we can talk outside the room. We go for a family meeting, and Mrs. Ms. tells a story of decreased functional status over the last year in particular over the last week. Roger Arnold states that Roger Arnold has been unable to have any activity of daily living for the last 6 months, he mostly is just sitting. She states his normal weight is around 165 and he has lost a 140 pounds. We talk about options, I share a diagram of the chronic illness pathway. Roger Arnold is tearful, stating that she has much stress with her husband's illness, and her own health issues (she recently had heart surgery). We talk about options including home with hospice if Roger Arnold is able to recover or hospice home of Roger Arnold if he does not show signs of improvement in 24 to 48 hours. Roger Arnold states that Roger Arnold has said recently that he was ready to go.    Roger sets up a family meeting for 5/22 at 1100 with Roger Arnold's mother.  She agrees to continue to treat the treatable for 24 to 48 hours. Considering if Roger Arnold is able to improve or not. Wife is considering returning to home with hospice if Roger Arnold has improvement (she will continue the current course of  treatment). They are also considering hospice home of Roger Arnold if Roger Arnold is unable to improve.  HCPOA NEXT OF KIN - wife Roger Arnold   SUMMARY OF RECOMMENDATIONS   continue to treat the treatable for 24 to 48 hours. Considering if Roger Arnold is able to improve or not. Wife is considering returning to home with hospice if Roger Arnold has improvement (she will continue the current course of treatment). They are also considering hospice home of Roger Arnold if Roger Arnold is unable to improve.  Code Status/Advance Care Planning:  DNR - Mrs. Katrinka Arnold, today, elects do not intubate.  Symptom Management:   per hospitalist, no additional needs at this time  Palliative Prophylaxis:   Oral Care and Turn Reposition  Additional Recommendations (Limitations, Scope, Preferences):  Continue with current course of treatment for 24 to 48 hours for ability to recover  Psycho-social/Spiritual:   Desire for further Chaplaincy support:no  Additional Recommendations: Caregiving  Support/Resources and Education on Hospice  Prognosis:   Unable to determine, based on outcomes. Days to weeks would not be surprising if  no improvement. Also weeks to months would not be surprising if Roger Arnold is able to recover.  Discharge Planning: To Be Determined      Primary Diagnoses: Present on Admission: . Acute on chronic respiratory failure with hypercapnia (HCC) . (Resolved) COPD exacerbation (HCC) . Acute encephalopathy . COPD with acute exacerbation (HCC) . Failure to thrive in adult . Descending aortic aneurysm (HCC)   I have reviewed the medical record, interviewed the patient and family, and examined the patient. The following aspects are pertinent.  Past Medical History:  Diagnosis Date  . Arthritis   . Back pain   . COPD (chronic obstructive pulmonary disease) (HCC)    Social History   Social History  . Marital status: Married    Spouse name: N/A  . Number of children: N/A    . Years of education: N/A   Social History Main Topics  . Smoking status: Current Every Day Smoker    Packs/day: 1.00    Years: 30.00    Types: Cigarettes    Start date: 06/10/1978  . Smokeless tobacco: Never Used  . Alcohol use No  . Drug use: No  . Sexual activity: Not Currently    Partners: Female   Other Topics Concern  . None   Social History Narrative  . None   Family History  Problem Relation Age of Onset  . Family history unknown: Yes   Scheduled Meds: . enoxaparin (LOVENOX) injection  40 mg Subcutaneous Q24H  . famotidine  20 mg Oral Daily  . ipratropium-albuterol  3 mL Nebulization Q6H  . methylPREDNISolone (SOLU-MEDROL) injection  60 mg Intravenous Q6H  . mometasone-formoterol  2 puff Inhalation BID  . sodium chloride flush  3 mL Intravenous Q12H   Continuous Infusions: . azithromycin Stopped (06/08/16 1805)   PRN Meds:.bisacodyl, guaiFENesin-dextromethorphan, hydrALAZINE, ibuprofen, ipratropium-albuterol, morphine injection, ondansetron **OR** ondansetron (ZOFRAN) IV, polyethylene glycol Medications Prior to Admission:  Prior to Admission medications   Medication Sig Start Date End Date Taking? Authorizing Provider  budesonide-formoterol (SYMBICORT) 160-4.5 MCG/ACT inhaler Inhale 2 puffs into the lungs 2 (two) times daily. 10/04/14  Yes Black, Lesle Chris, NP  PROAIR HFA 108 (90 BASE) MCG/ACT inhaler Inhale 2 puffs into the lungs every 6 (six) hours as needed. Shortness of breath,cough(rescue inhaler) 08/31/14  Yes [provider]   Allergies  Allergen Reactions  . Percocet [Oxycodone-Acetaminophen] Rash and Other (See Comments)    Has cold sweats   Review of Systems  Unable to perform ROS: Acuity of condition    Physical Exam  Constitutional:  Weak and frail, chronically ill appearing  HENT:  Head: Normocephalic and atraumatic.  Cardiovascular: Normal rate and regular rhythm.   Rate 110's at times  Pulmonary/Chest:  SOB, unable to speak at  this time  Abdominal: Soft. He exhibits no distension.  Musculoskeletal: He exhibits no edema.  Neurological: He is alert.  Unable to determine orientation, severe shortness of breath  Skin: Skin is warm and dry.  Nursing note and vitals reviewed.   Vital Signs: BP (!) 150/82   Pulse (!) 113   Temp 98 F (36.7 C) (Axillary)   Resp 19   Ht 5\' 9"  (1.753 m)   Wt 63.9 kg (140 lb 14 oz)   SpO2 96%   BMI 20.80 kg/m  Pain Assessment: 0-10 POSS *See Group Information*: 1-Acceptable,Awake and alert Pain Score: Asleep   SpO2: SpO2: 96 % O2 Device:SpO2: 96 % O2 Flow Rate: .O2 Flow Rate (L/min): 3 L/min  IO:  Intake/output summary:  Intake/Output Summary (Last 24 hours) at 06/09/16 1344 Last data filed at 06/09/16 0500  Gross per 24 hour  Intake              810 ml  Output             1300 ml  Net             -490 ml    LBM: Last BM Date: 05/21/2016 Baseline Weight: Weight: 69.4 kg (153 lb) Most recent weight: Weight: 63.9 kg (140 lb 14 oz)     Palliative Assessment/Data:   Flowsheet Rows     Most Recent Value  Intake Tab  Referral Department  Hospitalist  Unit at Time of Referral  ICU  Palliative Care Primary Diagnosis  Pulmonary  Date Notified  06/08/16  Palliative Care Type  New Palliative care  Reason for referral  Clarify Goals of Care  Date of Admission  06/13/2016  Date first seen by Palliative Care  06/09/16  # of days Palliative referral response time  1 Day(s)  # of days IP prior to Palliative referral  1  Clinical Assessment  Palliative Performance Scale Score  20%  Pain Max last 24 hours  Not able to report  Pain Min Last 24 hours  Not able to report  Dyspnea Max Last 24 Hours  Not able to report  Dyspnea Min Last 24 hours  Not able to report  Psychosocial & Spiritual Assessment  Palliative Care Outcomes  Patient/Family meeting held?  Yes  Who was at the meeting?  With wife [place]  Palliative Care Outcomes  Changed CPR status, Provided psychosocial or  spiritual support, Clarified goals of care, Counseled regarding hospice, Provided advance care planning  Patient/Family wishes: Interventions discontinued/not started   Mechanical Ventilation, PEG      Time In: 1200 Time Out: 1230 Time Total: 30 minutes Greater than 50%  of this time was spent counseling and coordinating care related to the above assessment and plan.  Signed by: Katheran Awe, NP   Please contact Palliative Medicine Team phone at [phone] for questions and concerns.  For individual provider: See Loretha Stapler

## 2016-06-09 NOTE — Progress Notes (Signed)
Subjective: He's overall about the same. He slept with BiPAP last night. He's a little more alert this morning. No new complaints. No chest pain nausea or vomiting  Objective: Vital signs in last 24 hours: Temp:  [96.9 F (36.1 C)-99 F (37.2 C)] 98 F (36.7 C) (05/21 0700) Pulse Rate:  [69-131] 72 (05/21 0700) Resp:  [11-25] 12 (05/21 0700) BP: (94-182)/(61-93) 129/75 (05/21 0700) SpO2:  [92 %-99 %] 98 % (05/21 0700) FiO2 (%):  [35 %] 35 % (05/21 0222) Weight:  [63.9 kg (140 lb 14 oz)] 63.9 kg (140 lb 14 oz) (05/21 0457) Weight change: -5.5 kg (-12 lb 2 oz) Last BM Date: 06/03/2016  Intake/Output from previous day: 05/20 0701 - 05/21 0700 In: 810 [P.O.:560; IV Piggyback:250] Out: 1300 [Urine:1300]  PHYSICAL EXAM General appearance: alert and mild distress Resp: Markedly diminished breath sounds bilaterally Cardio: regular rate and rhythm, S1, S2 normal, no murmur, click, rub or gallop GI: soft, non-tender; bowel sounds normal; no masses,  no organomegaly Extremities: extremities normal, atraumatic, no cyanosis or edema Skin warm and dry. Mucous membranes also slightly dry  Lab Results:  Results for orders placed or performed during the hospital encounter of 05/31/2016 (from the past 48 hour(s))  Comprehensive metabolic panel     Status: Abnormal   Collection Time: 06/03/2016  1:43 PM  Result Value Ref Range   Sodium 140 135 - 145 mmol/L   Potassium 3.8 3.5 - 5.1 mmol/L   Chloride 97 (L) 101 - 111 mmol/L   CO2 35 (H) 22 - 32 mmol/L   Glucose, Bld 105 (H) 65 - 99 mg/dL   BUN 15 6 - 20 mg/dL   Creatinine, Ser 0.91 0.61 - 1.24 mg/dL   Calcium 9.5 8.9 - 10.3 mg/dL   Total Protein 7.5 6.5 - 8.1 g/dL   Albumin 4.3 3.5 - 5.0 g/dL   AST 22 15 - 41 U/L   ALT 14 (L) 17 - 63 U/L   Alkaline Phosphatase 118 38 - 126 U/L   Total Bilirubin 0.7 0.3 - 1.2 mg/dL   GFR calc non Af Amer >60 >60 mL/min   GFR calc Af Amer >60 >60 mL/min    Comment: (NOTE) The eGFR has been calculated using  the CKD EPI equation. This calculation has not been validated in all clinical situations. eGFR's persistently <60 mL/min signify possible Chronic Kidney Disease.    Anion gap 8 5 - 15  Lipase, blood     Status: None   Collection Time: 06/18/2016  1:43 PM  Result Value Ref Range   Lipase 14 11 - 51 U/L  CBC with Differential/Platelet     Status: Abnormal   Collection Time: 06/19/2016  1:43 PM  Result Value Ref Range   WBC 7.0 4.0 - 10.5 K/uL   RBC 4.58 4.22 - 5.81 MIL/uL   Hemoglobin 13.6 13.0 - 17.0 g/dL   HCT 40.4 39.0 - 52.0 %   MCV 88.2 78.0 - 100.0 fL   MCH 29.7 26.0 - 34.0 pg   MCHC 33.7 30.0 - 36.0 g/dL   RDW 13.6 11.5 - 15.5 %   Platelets 250 150 - 400 K/uL   Neutrophils Relative % 80 %   Neutro Abs 5.6 1.7 - 7.7 K/uL   Lymphocytes Relative 7 %   Lymphs Abs 0.5 (L) 0.7 - 4.0 K/uL   Monocytes Relative 11 %   Monocytes Absolute 0.8 0.1 - 1.0 K/uL   Eosinophils Relative 2 %   Eosinophils Absolute  0.1 0.0 - 0.7 K/uL   Basophils Relative 0 %   Basophils Absolute 0.0 0.0 - 0.1 K/uL  Ethanol     Status: None   Collection Time: 06/16/2016  1:43 PM  Result Value Ref Range   Alcohol, Ethyl (B) <5 <5 mg/dL    Comment:        LOWEST DETECTABLE LIMIT FOR SERUM ALCOHOL IS 5 mg/dL FOR MEDICAL PURPOSES ONLY   Urinalysis, Routine w reflex microscopic     Status: Abnormal   Collection Time: 06/13/2016  1:44 PM  Result Value Ref Range   Color, Urine YELLOW YELLOW   APPearance CLEAR CLEAR   Specific Gravity, Urine 1.021 1.005 - 1.030   pH 5.0 5.0 - 8.0   Glucose, UA NEGATIVE NEGATIVE mg/dL   Hgb urine dipstick SMALL (A) NEGATIVE   Bilirubin Urine NEGATIVE NEGATIVE   Ketones, ur NEGATIVE NEGATIVE mg/dL   Protein, ur NEGATIVE NEGATIVE mg/dL   Nitrite NEGATIVE NEGATIVE   Leukocytes, UA NEGATIVE NEGATIVE   RBC / HPF 6-30 0 - 5 RBC/hpf   WBC, UA 0-5 0 - 5 WBC/hpf   Bacteria, UA NONE SEEN NONE SEEN   Squamous Epithelial / LPF NONE SEEN NONE SEEN   Mucous PRESENT   Brain natriuretic  peptide     Status: None   Collection Time: 06/03/2016  1:44 PM  Result Value Ref Range   B Natriuretic Peptide 55.0 0.0 - 100.0 pg/mL  D-dimer, quantitative (not at [facility])     Status: Abnormal   Collection Time: 05/25/2016  1:44 PM  Result Value Ref Range   D-Dimer, Quant 0.59 (H) 0.00 - 0.50 ug/mL-FEU    Comment: (NOTE) At the manufacturer cut-off of 0.50 ug/mL FEU, this assay has been documented to exclude PE with a sensitivity and negative predictive value of 97 to 99%.  At this time, this assay has not been approved by the FDA to exclude DVT/VTE. Results should be correlated with clinical presentation.   TSH     Status: None   Collection Time: 06/11/2016  1:44 PM  Result Value Ref Range   TSH 0.559 0.350 - 4.500 uIU/mL    Comment: Performed by a 3rd Generation assay with a functional sensitivity of <=0.01 uIU/mL.  I-stat troponin, ED     Status: None   Collection Time: 06/08/2016  1:54 PM  Result Value Ref Range   Troponin i, poc 0.01 0.00 - 0.08 ng/mL   Comment 3            Comment: Due to the release kinetics of cTnI, a negative result within the first hours of the onset of symptoms does not rule out myocardial infarction with certainty. If myocardial infarction is still suspected, repeat the test at appropriate intervals.   I-Stat CG4 Lactic Acid, ED     Status: None   Collection Time: 05/26/2016  1:55 PM  Result Value Ref Range   Lactic Acid, Venous 1.32 0.5 - 1.9 mmol/L  Blood gas, arterial ([facility] & [facility] ONLY)     Status: Abnormal   Collection Time: 06/19/2016  2:00 PM  Result Value Ref Range   FIO2 40.00    Delivery systems BILEVEL POSITIVE AIRWAY PRESSURE    LHR 10 resp/min   Inspiratory PAP 18    Expiratory PAP 8    pH, Arterial 7.348 (L) 7.350 - 7.450   pCO2 arterial 59.7 (H) 32.0 - 48.0 mmHg    Comment: RBV A.CREWS,RN AT 1408 BY V.LAWSON,RRT ON 06/17/2016  pO2, Arterial 129 (H) 83.0 - 108.0 mmHg   Bicarbonate 29.0 (H) 20.0 - 28.0 mmol/L   Acid-Base Excess 6.5 (H) 0.0 - 2.0  mmol/L   O2 Saturation 98.5 %   Patient temperature 37.0    Collection site RIGHT RADIAL    Drawn by [id number]    Sample type ARTERIAL    Allens test (pass/fail) PASS PASS  I-stat Chem 8, ED     Status: Abnormal   Collection Time: 06/02/2016  2:03 PM  Result Value Ref Range   Sodium 138 135 - 145 mmol/L   Potassium 3.7 3.5 - 5.1 mmol/L   Chloride 99 (L) 101 - 111 mmol/L   BUN 15 6 - 20 mg/dL   Creatinine, Ser 1.00 0.61 - 1.24 mg/dL   Glucose, Bld 101 (H) 65 - 99 mg/dL   Calcium, Ion 1.08 (L) 1.15 - 1.40 mmol/L   TCO2 32 0 - 100 mmol/L   Hemoglobin 13.3 13.0 - 17.0 g/dL   HCT 39.0 39.0 - 52.0 %  MRSA PCR Screening     Status: None   Collection Time: 06/06/2016  6:00 PM  Result Value Ref Range   MRSA by PCR NEGATIVE NEGATIVE    Comment:        The GeneXpert MRSA Assay (FDA approved for NASAL specimens only), is one component of a comprehensive MRSA colonization surveillance program. It is not intended to diagnose MRSA infection nor to guide or monitor treatment for MRSA infections.   Rapid urine drug screen (hospital performed)     Status: None   Collection Time: 05/29/2016 11:40 PM  Result Value Ref Range   Opiates NONE DETECTED NONE DETECTED   Cocaine NONE DETECTED NONE DETECTED   Benzodiazepines NONE DETECTED NONE DETECTED   Amphetamines NONE DETECTED NONE DETECTED   Tetrahydrocannabinol NONE DETECTED NONE DETECTED   Barbiturates NONE DETECTED NONE DETECTED    Comment:        DRUG SCREEN FOR MEDICAL PURPOSES ONLY.  IF CONFIRMATION IS NEEDED FOR ANY PURPOSE, NOTIFY LAB WITHIN 5 DAYS.        LOWEST DETECTABLE LIMITS FOR URINE DRUG SCREEN Drug Class       Cutoff (ng/mL) Amphetamine      1000 Barbiturate      200 Benzodiazepine   045 Tricyclics       409 Opiates          300 Cocaine          300 THC              50   Basic metabolic panel     Status: Abnormal   Collection Time: 06/08/16  4:52 AM  Result Value Ref Range   Sodium 138 135 - 145 mmol/L   Potassium 4.9  3.5 - 5.1 mmol/L    Comment: DELTA CHECK NOTED NO VISIBLE HEMOLYSIS    Chloride 100 (L) 101 - 111 mmol/L   CO2 31 22 - 32 mmol/L   Glucose, Bld 162 (H) 65 - 99 mg/dL   BUN 12 6 - 20 mg/dL   Creatinine, Ser 0.85 0.61 - 1.24 mg/dL   Calcium 8.8 (L) 8.9 - 10.3 mg/dL   GFR calc non Af Amer >60 >60 mL/min   GFR calc Af Amer >60 >60 mL/min    Comment: (NOTE) The eGFR has been calculated using the CKD EPI equation. This calculation has not been validated in all clinical situations. eGFR's persistently <60 mL/min signify possible Chronic Kidney Disease.  Anion gap 7 5 - 15  CBC     Status: Abnormal   Collection Time: 06/08/16  4:52 AM  Result Value Ref Range   WBC 3.5 (L) 4.0 - 10.5 K/uL   RBC 4.12 (L) 4.22 - 5.81 MIL/uL   Hemoglobin 12.2 (L) 13.0 - 17.0 g/dL   HCT 35.9 (L) 39.0 - 52.0 %   MCV 87.1 78.0 - 100.0 fL   MCH 29.6 26.0 - 34.0 pg   MCHC 34.0 30.0 - 36.0 g/dL   RDW 13.2 11.5 - 15.5 %   Platelets 212 150 - 400 K/uL  Basic metabolic panel     Status: Abnormal   Collection Time: 06/09/16  5:01 AM  Result Value Ref Range   Sodium 139 135 - 145 mmol/L   Potassium 4.7 3.5 - 5.1 mmol/L   Chloride 99 (L) 101 - 111 mmol/L   CO2 35 (H) 22 - 32 mmol/L   Glucose, Bld 151 (H) 65 - 99 mg/dL   BUN 16 6 - 20 mg/dL   Creatinine, Ser 0.90 0.61 - 1.24 mg/dL   Calcium 9.0 8.9 - 10.3 mg/dL   GFR calc non Af Amer >60 >60 mL/min   GFR calc Af Amer >60 >60 mL/min    Comment: (NOTE) The eGFR has been calculated using the CKD EPI equation. This calculation has not been validated in all clinical situations. eGFR's persistently <60 mL/min signify possible Chronic Kidney Disease.    Anion gap 5 5 - 15  Glucose, capillary     Status: Abnormal   Collection Time: 06/09/16  7:45 AM  Result Value Ref Range   Glucose-Capillary 126 (H) 65 - 99 mg/dL    ABGS  Recent Labs  05/31/2016 1400 06/17/2016 1403  PHART 7.348*  --   PO2ART 129*  --   TCO2  --  32  HCO3 29.0*  --     CULTURES Recent Results (from the past 240 hour(s))  MRSA PCR Screening     Status: None   Collection Time: 05/20/2016  6:00 PM  Result Value Ref Range Status   MRSA by PCR NEGATIVE NEGATIVE Final    Comment:        The GeneXpert MRSA Assay (FDA approved for NASAL specimens only), is one component of a comprehensive MRSA colonization surveillance program. It is not intended to diagnose MRSA infection nor to guide or monitor treatment for MRSA infections.    Studies/Results: Ct Head Wo Contrast  Result Date: 06/02/2016 CLINICAL DATA:  Altered mental status and hypoxia. Lethargic. Loss of appetite 1 year. Difficulty breathing today. EXAM: CT HEAD WITHOUT CONTRAST TECHNIQUE: Contiguous axial images were obtained from the base of the skull through the vertex without intravenous contrast. COMPARISON:  None. FINDINGS: Brain: Ventricles, cisterns and other CSF spaces are within normal. There is no mass, mass effect, shift of midline structures or acute hemorrhage. No evidence to suggest acute infarction. Mild chronic ischemic microvascular disease. Vascular: No hyperdense vessel or unexpected calcification. Skull: Within normal. Sinuses/Orbits: Orbits are normal. Mild mucosal membrane thickening over the left frontal sinus and frontoethmoidal recess as well as mild opacification over the ethmoid air cells and right maxillary sinus. Mastoid air cells are clear. Other: None. IMPRESSION: No acute intracranial findings. Mild chronic ischemic microvascular disease. Mild chronic sinus inflammatory change. Electronically Signed   By: Marin Olp M.D.   On: 05/21/2016 16:00   Ct Abdomen Pelvis W Contrast  Result Date: 06/08/2016 CLINICAL DATA:  The patient is lethargic with  abdominal pain and loss of appetite. EXAM: CT ABDOMEN AND PELVIS WITH CONTRAST TECHNIQUE: Multidetector CT imaging of the abdomen and pelvis was performed using the standard protocol following bolus administration of intravenous  contrast. CONTRAST:  110m ISOVUE-300 IOPAMIDOL (ISOVUE-300) INJECTION 61% COMPARISON:  None. FINDINGS: Lower chest: The descending thoracic aorta measures up to 3.9 cm on series 2, image 1. The lung bases are otherwise unremarkable. Hepatobiliary: Cholelithiasis is identified in a mildly distended gallbladder. A probable tiny cyst is seen in the left hepatic lobe on axial image 11. This is too small to completely characterize. The liver and portal vein are otherwise normal. Pancreas: Unremarkable. No pancreatic ductal dilatation or surrounding inflammatory changes. Spleen: Normal in size without focal abnormality. Adrenals/Urinary Tract: There is a cyst in the right kidney. 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 The lower pole moiety is atrophic. An extrarenal pelvis associated with the lower pole moiety is mildly prominent. No hydronephrosis. The distal right ureter is somewhat thickened as seen on coronal image 75. High attenuation foci just lateral to the right UVJ likely represent tiny previously passed stones. There is a small diverticulum associated with the lateral right bladder containing a high attenuation focus as seen on coronal image 70. The ureters and bladder are otherwise unremarkable. Stomach/Bowel: The stomach and small bowel are normal. There is a rounded defect along the left lateral wall of the rectum as seen on coronal image 81 and axial image 62 measuring 1.5 cm. Scattered colonic diverticuli are seen without diverticulitis. Remainder of the colon is unremarkable. The appendix is unremarkable as well. Vascular/Lymphatic: The proximal abdominal aorta measures 3.65 cm on series 2, image 8. There is atherosclerosis throughout the aorta. No dissection. The remainder of the aorta is normal in caliber. No adenopathy identified. Reproductive: Prostate is unremarkable. Other: No free air or free fluid. Evaluation of the upper to mid abdomen is limited by motion.  Musculoskeletal: No acute or significant osseous findings. IMPRESSION: 1. Evaluation of the upper abdomen is limited due to respiratory motion. 2. 15 mm filling defect in the left lateral aspect of the rectum. This could represent adherent stool but a polyp or mass is not excluded. Recommend direct visualization. 3. The distal right ureter appears thickened, best seen on coronal images. This is an age-indeterminate finding of uncertain etiology. There are a few tiny stones in the bladder, just lateral to the UVJ which are consistent with previously passed stones of uncertain chronicity. There is a bladder diverticulum containing a tiny stone. 4. Multiple stones are seen in the mildly distended gallbladder with no wall thickening. An ultrasound could better evaluate the gallbladder. 5. The descending thoracic aorta measures 3.9 cm and the proximal abdominal aortic measures 3.65 cm. Recommend followup by ultrasound in 2 years. This recommendation follows ACR consensus guidelines: White Paper of the ACR Incidental Findings Committee II on Vascular Findings. J Am Coll Radiol 2013; 10:789-794. Electronically Signed   By: DDorise BullionIII M.D   On: 05/23/2016 16:14   Dg Chest Portable 1 View  Result Date: 06/14/2016 CLINICAL DATA:  Shortness of breath and altered mental status. EXAM: PORTABLE CHEST 1 VIEW COMPARISON:  09/21/2014 FINDINGS: Lungs are hyperexpanded without focal consolidation or effusion. Mild stable tenting of the left hemidiaphragm. Cardiac silhouette is within normal. Minimal calcification and tortuosity of the thoracic aorta. Remainder of the exam is unchanged. IMPRESSION: No acute cardiopulmonary disease. COPD. Aortic atherosclerosis. Electronically Signed   By: DMarin OlpM.D.  On: 05/24/2016 14:02    Medications:  Prior to Admission:  Prescriptions Prior to Admission  Medication Sig Dispense Refill Last Dose  . budesonide-formoterol (SYMBICORT) 160-4.5 MCG/ACT inhaler Inhale 2 puffs  into the lungs 2 (two) times daily. 1 Inhaler 12 06/19/2016 at Unknown time  . PROAIR HFA 108 (90 BASE) MCG/ACT inhaler Inhale 2 puffs into the lungs every 6 (six) hours as needed. Shortness of breath,cough(rescue inhaler)  1 06/08/2016 at Unknown time   Scheduled: . enoxaparin (LOVENOX) injection  40 mg Subcutaneous Q24H  . famotidine  20 mg Oral Daily  . ipratropium-albuterol  3 mL Nebulization Q6H  . methylPREDNISolone (SOLU-MEDROL) injection  60 mg Intravenous Q6H  . mometasone-formoterol  2 puff Inhalation BID  . sodium chloride flush  3 mL Intravenous Q12H   Continuous: . azithromycin Stopped (06/08/16 1805)   PHK:FEXMDYJWL, guaiFENesin-dextromethorphan, hydrALAZINE, ibuprofen, ipratropium-albuterol, morphine injection, ondansetron **OR** ondansetron (ZOFRAN) IV, polyethylene glycol  Assesment: He was admitted with COPD with exacerbation. He has acute on chronic hypoxic and hypercapnic respiratory failure. He has acute encephalopathy related to hypercapnia. His COPD at baseline is very severe Principal Problem:   COPD with acute exacerbation ([place]) Active Problems:   Acute on chronic respiratory failure with hypercapnia (HCC)   Acute encephalopathy   Failure to thrive in adult   Acute on chronic respiratory failure with hypoxia and hypercapnia ([facility])   Descending aortic aneurysm ([place])    Plan: Continue current treatments. Agree with palliative care consultation. Continue BiPAP as needed.    LOS: 2 days   Elin Seats L 06/09/2016, 8:15 AM

## 2016-06-09 NOTE — Progress Notes (Addendum)
Initial Nutrition Assessment  DOCUMENTATION CODES:   Severe malnutrition in context of chronic illness (Acute exacerbation of End-stage COPD)  INTERVENTION:  Provide commercial oral nutrition support: Ensure Enlive po BID, each supplement provides 350 kcal and 20 grams of protein   Collaborate with other providers to educate pt and family and assist with supporting during their process of making healthcare decisions   NUTRITION DIAGNOSIS:   Malnutrition related to chronic illness (acute exacerbation of chronic end-stage COPD) as evidenced by (15% wt loss in < 6 months)/ percent weight loss, energy intake < or equal to 75% for > or equal to 1 month.   GOAL:    (Meet nutrition needs as able within the patient wishes for care progression )   MONITOR:   Supplement acceptance, PO intake, Weight trends  REASON FOR ASSESSMENT:   Consult Assessment of nutrition requirement/status  ASSESSMENT:  The patient has end-stage COPD and presents with exacerbation. Adult Failure to thrive and unplanned wt loss since December from 165-141# (15%).   The patient is eating 50-100% of his meals. This morning his wife is feeding him. He is visibly short of breath and needing rest between bites of food. At home she has been letting him eat "whatever". He has ONS order between meals if he desires to consume.    Nutrition focused exam completed after initial visit yesterday. Moderate-severe muscle loss and mild- moderate depletion of fat stores. Mobility significantly decreased the past few month and overall declining for the past year per wife.  Recent Labs Lab 06/19/2016 1343 06/05/2016 1403 06/08/16 0452 06/09/16 0501  NA 140 138 138 139  K 3.8 3.7 4.9 4.7  CL 97* 99* 100* 99*  CO2 35*  --  31 35*  BUN 15 15 12 16   CREATININE 0.91 1.00 0.85 0.90  CALCIUM 9.5  --  8.8* 9.0  GLUCOSE 105* 101* 162* 151*  L:abs and meds reviewed.    Diet Order:  Diet regular Room service appropriate? Yes; Fluid  consistency: Thin  Skin:  Reviewed, no issues  Last BM:  5/19   Height:   Ht Readings from Last 1 Encounters:  06/06/2016 5\' 9"  (1.753 m)    Weight:   Wt Readings from Last 1 Encounters:  06/09/16 140 lb 14 oz (63.9 kg)    Ideal Body Weight:  73 kg  BMI:  Body mass index is 20.8 kg/m.  Estimated Nutritional Needs:   Kcal:  1920-2240 (30-35 kcal/kg)  Protein:  83-96 (1.3-1.5 gr/kg)  Fluid:  1.9-2.2 liters daily  EDUCATION NEEDS:   No education needs identified at this time (Spouse is talking about tranisitioning her husband to Hospice and does not want agressive nutritional care)  Royann ShiversLynn Yena Tisby MS,RD,CSG,LDN Office: [phone] Pager: [phone]

## 2016-06-09 NOTE — Progress Notes (Signed)
PT Cancellation Note  Patient Details Name: Roger Arnold MRN: [id number] DOB: 1955/06/24   Cancelled Treatment:    Reason Eval/Treat Not Completed: Patient not medically ready (Pt is on BiPAP.  Per RN, he has great difficulty with any mobility, including turning and scooting up in the bed due to becomming SOB.  Family is considering Hospice.  Will check back tomorrow.)   Carollee HerterBeth Olney Monier, PT, DPT X: [phone]

## 2016-06-09 NOTE — Progress Notes (Signed)
PROGRESS NOTE    Roger DarterSteve Arnold  ZOX:[id number] DOB: 07-Jun-1955 DOA: 09/27/16 PCP: Avis EpleyJackson, Samantha J, PA-C    Brief Narrative:  61 year old male with a history of oxygen dependent/steroid dependent COPD presents here with worsening shortness of breath and confusion. Found to have elevated PCO2 and COPD exacerbation. Admitted to the stepdown unit on BiPAP. Pulmonology following. Palliative care also consulted for goals of care.   Assessment & Plan:   Principal Problem:   COPD with acute exacerbation (HCC) Active Problems:   Acute on chronic respiratory failure with hypercapnia (HCC)   Acute encephalopathy   Failure to thrive in adult   Acute on chronic respiratory failure with hypoxia and hypercapnia (HCC)   Descending aortic aneurysm (HCC)  1. Acute on chronic respiratory failure with hypoxia and hypercarbia. Related to COPD exacerbation. Currently on BiPAP. Appreciate pulmonology assistance. Continue current treatments. He is chronically on 2 L. 2. Steroid-dependent, oxygen dependent COPD exacerbation. Started on intravenous steroids, bronchodilators and antibiotics. He has very advanced COPD at baseline. Continue current treatments. 3. Acute encephalopathy. Secondary to CO2 narcosis. Improvement after starting on BiPAP. Continue current treatments. 4. Failure to thrive. Suspect this is related to advanced COPD . 5. Incidental finding on CT abdomen/pelvis of rectal defect. Further evaluation as an outpatient if stabilizes. 6. Descending aortic aneurysm. To be followed as an outpatient. 7. Discussion. Had an honest discussion with the patient's wife regarding the patient's poor state of health and overall prognosis. She confirmed that he has been declining for the past year. She agrees that his COPD is quite advanced. He has poor quality of life. He ambulates minimally at home and has been losing weight due to poor by mouth intake. She reports that he has said several times that he is  "ready to die". We'll consult palliative care to further discuss goals of care. Patient would be a very good candidate for hospice. In the event of cardiac arrest, she would not want the patient undergo CPR. In the event of an isolated respiratory arrest, she would want him to be intubated for now until consensus with rest or family can be obtained regarding his goals of care.   DVT prophylaxis: lovenox Code Status: limited code, no cpr or defibrillation Family Communication: discussed with wife at the bedside Disposition Plan: pending hospital course   Consultants:   Pulmonology  Palliative care  Procedures:     Antimicrobials:   Azithromycin 5/19>>   Subjective: More awake today. Feels that breathing is unchanged from yesterday  Objective: Vitals:   06/09/16 0457 06/09/16 0500 06/09/16 0600 06/09/16 0700  BP:  (!) 141/68 139/77 129/75  Pulse:  76 85 72  Resp:  12 16 12   Temp:  (!) 96.9 F (36.1 C)  98 F (36.7 C)  TempSrc:  Axillary  Axillary  SpO2:  99% 98% 98%  Weight: 63.9 kg (140 lb 14 oz)     Height:        Intake/Output Summary (Last 24 hours) at 06/09/16 0822 Last data filed at 06/09/16 0500  Gross per 24 hour  Intake              810 ml  Output             1300 ml  Net             -490 ml   Filed Weights   13-Jun-2016 1811 06/08/16 0500 06/09/16 0457  Weight: 64.9 kg (143 lb 1.3 oz) 66.5 kg (146 lb 9.7  oz) 63.9 kg (140 lb 14 oz)    Examination:  General exam: Alert, awake, oriented x 3 Respiratory system: diminished breath sounds with no wheeze. Increased Respiratory effort  Cardiovascular system:tachycardic. No murmurs, rubs, gallops. Gastrointestinal system: Abdomen is nondistended, soft and nontender. No organomegaly or masses felt. Normal bowel sounds heard. Central nervous system: Alert and oriented. No focal neurological deficits. Extremities: No C/C/E, +pedal pulses Skin: mottling of skin on feet bilaterally, pulses intact Psychiatry:  Judgement and insight appear normal. Mood & affect appropriate.   Data Reviewed: I have personally reviewed following labs and imaging studies  CBC:  Recent Labs Lab 05/31/2016 1343 05/25/2016 1403 06/08/16 0452  WBC 7.0  --  3.5*  NEUTROABS 5.6  --   --   HGB 13.6 13.3 12.2*  HCT 40.4 39.0 35.9*  MCV 88.2  --  87.1  PLT 250  --  212   Basic Metabolic Panel:  Recent Labs Lab 05/24/2016 1343 06/19/2016 1403 06/08/16 0452 06/09/16 0501  NA 140 138 138 139  K 3.8 3.7 4.9 4.7  CL 97* 99* 100* 99*  CO2 35*  --  31 35*  GLUCOSE 105* 101* 162* 151*  BUN 15 15 12 16   CREATININE 0.91 1.00 0.85 0.90  CALCIUM 9.5  --  8.8* 9.0   GFR: Estimated Creatinine Clearance: 76.9 mL/min (by C-G formula based on SCr of 0.9 mg/dL). Liver Function Tests:  Recent Labs Lab 06/06/2016 1343  AST 22  ALT 14*  ALKPHOS 118  BILITOT 0.7  PROT 7.5  ALBUMIN 4.3    Recent Labs Lab 06/11/2016 1343  LIPASE 14   No results for input(s): AMMONIA in the last 168 hours. Coagulation Profile: No results for input(s): INR, PROTIME in the last 168 hours. Cardiac Enzymes: No results for input(s): CKTOTAL, CKMB, CKMBINDEX, TROPONINI in the last 168 hours. BNP (last 3 results) No results for input(s): PROBNP in the last 8760 hours. HbA1C: No results for input(s): HGBA1C in the last 72 hours. CBG:  Recent Labs Lab 06/09/16 0745  GLUCAP 126*   Lipid Profile: No results for input(s): CHOL, HDL, LDLCALC, TRIG, CHOLHDL, LDLDIRECT in the last 72 hours. Thyroid Function Tests:  Recent Labs  06/06/2016 1344  TSH 0.559   Anemia Panel: No results for input(s): VITAMINB12, FOLATE, FERRITIN, TIBC, IRON, RETICCTPCT in the last 72 hours. Sepsis Labs:  Recent Labs Lab 06/06/2016 1355  LATICACIDVEN 1.32    Recent Results (from the past 240 hour(s))  MRSA PCR Screening     Status: None   Collection Time: 06/06/2016  6:00 PM  Result Value Ref Range Status   MRSA by PCR NEGATIVE NEGATIVE Final    Comment:         The GeneXpert MRSA Assay (FDA approved for NASAL specimens only), is one component of a comprehensive MRSA colonization surveillance program. It is not intended to diagnose MRSA infection nor to guide or monitor treatment for MRSA infections.          Radiology Studies: Ct Head Wo Contrast  Result Date: 06/19/2016 CLINICAL DATA:  Altered mental status and hypoxia. Lethargic. Loss of appetite 1 year. Difficulty breathing today. EXAM: CT HEAD WITHOUT CONTRAST TECHNIQUE: Contiguous axial images were obtained from the base of the skull through the vertex without intravenous contrast. COMPARISON:  None. FINDINGS: Brain: Ventricles, cisterns and other CSF spaces are within normal. There is no mass, mass effect, shift of midline structures or acute hemorrhage. No evidence to suggest acute infarction. Mild chronic ischemic  microvascular disease. Vascular: No hyperdense vessel or unexpected calcification. Skull: Within normal. Sinuses/Orbits: Orbits are normal. Mild mucosal membrane thickening over the left frontal sinus and frontoethmoidal recess as well as mild opacification over the ethmoid air cells and right maxillary sinus. Mastoid air cells are clear. Other: None. IMPRESSION: No acute intracranial findings. Mild chronic ischemic microvascular disease. Mild chronic sinus inflammatory change. Electronically Signed   By: Elberta Fortis M.D.   On: Jun 22, 2016 16:00   Ct Abdomen Pelvis W Contrast  Result Date: 2016-06-22 CLINICAL DATA:  The patient is lethargic with abdominal pain and loss of appetite. EXAM: CT ABDOMEN AND PELVIS WITH CONTRAST TECHNIQUE: Multidetector CT imaging of the abdomen and pelvis was performed using the standard protocol following bolus administration of intravenous contrast. CONTRAST:  ISOVUE-300 IOPAMIDOL (ISOVUE-300) INJECTION 61% COMPARISON:  None. FINDINGS: Lower chest: The descending thoracic aorta measures up to 3.9 cm on series 2, image 1. The lung bases  are otherwise unremarkable. Hepatobiliary: Cholelithiasis is identified in a mildly distended gallbladder. A probable tiny cyst is seen in the left hepatic lobe on axial image 11. This is too small to completely characterize. The liver and portal vein are otherwise normal. Pancreas: Unremarkable. No pancreatic ductal dilatation or surrounding inflammatory changes. Spleen: Normal in size without focal abnormality. Adrenals/Urinary Tract: There is a cyst in the right kidney. 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 The lower pole moiety is atrophic. An extrarenal pelvis associated with the lower pole moiety is mildly prominent. No hydronephrosis. The distal right ureter is somewhat thickened as seen on coronal image 75. High attenuation foci just lateral to the right UVJ likely represent tiny previously passed stones. There is a small diverticulum associated with the lateral right bladder containing a high attenuation focus as seen on coronal image 70. The ureters and bladder are otherwise unremarkable. Stomach/Bowel: The stomach and small bowel are normal. There is a rounded defect along the left lateral wall of the rectum as seen on coronal image 81 and axial image 62 measuring 1.5 cm. Scattered colonic diverticuli are seen without diverticulitis. Remainder of the colon is unremarkable. The appendix is unremarkable as well. Vascular/Lymphatic: The proximal abdominal aorta measures 3.65 cm on series 2, image 8. There is atherosclerosis throughout the aorta. No dissection. The remainder of the aorta is normal in caliber. No adenopathy identified. Reproductive: Prostate is unremarkable. Other: No free air or free fluid. Evaluation of the upper to mid abdomen is limited by motion. Musculoskeletal: No acute or significant osseous findings. IMPRESSION: 1. Evaluation of the upper abdomen is limited due to respiratory motion. 2. 15 mm filling defect in the left lateral aspect of the rectum. This  could represent adherent stool but a polyp or mass is not excluded. Recommend direct visualization. 3. The distal right ureter appears thickened, best seen on coronal images. This is an age-indeterminate finding of uncertain etiology. There are a few tiny stones in the bladder, just lateral to the UVJ which are consistent with previously passed stones of uncertain chronicity. There is a bladder diverticulum containing a tiny stone. 4. Multiple stones are seen in the mildly distended gallbladder with no wall thickening. An ultrasound could better evaluate the gallbladder. 5. The descending thoracic aorta measures 3.9 cm and the proximal abdominal aortic measures 3.65 cm. Recommend followup by ultrasound in 2 years. This recommendation follows ACR consensus guidelines: White Paper of the ACR Incidental Findings Committee II on Vascular Findings. J Am Coll Radiol 2013; 10:789-794. Electronically  Signed   By: Gerome Sam III M.D   On: 06/06/2016 16:14   Dg Chest Portable 1 View  Result Date: 06/16/2016 CLINICAL DATA:  Shortness of breath and altered mental status. EXAM: PORTABLE CHEST 1 VIEW COMPARISON:  09/21/2014 FINDINGS: Lungs are hyperexpanded without focal consolidation or effusion. Mild stable tenting of the left hemidiaphragm. Cardiac silhouette is within normal. Minimal calcification and tortuosity of the thoracic aorta. Remainder of the exam is unchanged. IMPRESSION: No acute cardiopulmonary disease. COPD. Aortic atherosclerosis. Electronically Signed   By: Elberta Fortis M.D.   On: 06/03/2016 14:02        Scheduled Meds: . enoxaparin (LOVENOX) injection  40 mg Subcutaneous Q24H  . famotidine  20 mg Oral Daily  . ipratropium-albuterol  3 mL Nebulization Q6H  . methylPREDNISolone (SOLU-MEDROL) injection  60 mg Intravenous Q6H  . mometasone-formoterol  2 puff Inhalation BID  . sodium chloride flush  3 mL Intravenous Q12H   Continuous Infusions: . azithromycin Stopped (06/08/16 1805)      LOS: 2 days    Time spent:    Shelia Magallon, MD [facility] Pager [phone]  If 7PM-7AM, please contact night-coverage [email or web address] Password [email or web address] 06/09/2016, 8:22 AM

## 2016-06-10 DIAGNOSIS — E43 Unspecified severe protein-calorie malnutrition: Secondary | ICD-10-CM

## 2016-06-10 LAB — GLUCOSE, CAPILLARY: GLUCOSE-CAPILLARY: 120 mg/dL — AB (ref 65–99)

## 2016-06-10 MED ORDER — BISACODYL 10 MG RE SUPP
10.0000 mg | Freq: Every day | RECTAL | Status: DC | PRN
  Filled 2016-06-10: qty 1

## 2016-06-10 MED ORDER — SODIUM CHLORIDE 0.9 % IV SOLN
2.0000 mg/h | INTRAVENOUS | Status: DC
  Administered 2016-06-10: 1 mg/h via INTRAVENOUS
  Administered 2016-06-10: 2 mg/h via INTRAVENOUS
  Administered 2016-06-10 (×2): 1 mg/h via INTRAVENOUS
  Administered 2016-06-10: 1.5 mg/h via INTRAVENOUS
  Filled 2016-06-10: qty 10

## 2016-06-10 NOTE — Progress Notes (Signed)
PROGRESS NOTE    Roger Arnold  AOZ:[id number] DOB: 03-16-1955 DOA: 2016-06-22 PCP: Avis Epley, PA-C    Brief Narrative:  61 year old male with a history of oxygen dependent/steroid dependent COPD presents here with worsening shortness of breath and confusion. Found to have elevated PCO2 and COPD exacerbation. Admitted to the stepdown unit on BiPAP. Pulmonology following. Palliative care also consulted for goals of care.   Assessment & Plan:   Principal Problem:   COPD with acute exacerbation (HCC) Active Problems:   Acute on chronic respiratory failure with hypercapnia (HCC)   Acute encephalopathy   Failure to thrive in adult   Acute on chronic respiratory failure with hypoxia and hypercapnia (HCC)   Descending aortic aneurysm [facility])   Palliative care encounter   Goals of care, counseling/discussion   DNR (do not resuscitate) discussion   Encounter for hospice care discussion   Protein-calorie malnutrition, severe  1. Acute on chronic respiratory failure with hypoxia and hypercarbia. Related to COPD exacerbation. Currently on BiPAP. Appreciate pulmonology assistance. Continue current treatments. He is chronically on 2 L. He's had difficulty coming off of BiPAP. Pulmonology following 2. Steroid-dependent, oxygen dependent COPD exacerbation. On intravenous steroids, bronchodilators and antibiotics. He has very advanced COPD at baseline. Continue current treatments. 3. Acute encephalopathy. Secondary to CO2 narcosis. Improvement after starting on BiPAP. Continue current treatments. 4. Failure to thrive. Suspect this is related to advanced COPD . 5. Incidental finding on CT abdomen/pelvis of rectal defect. Further evaluation as an outpatient if stabilizes. 6. Descending aortic aneurysm. To be followed as an outpatient. 7. Discussion. Had an honest discussion with the patient's wife regarding the patient's poor state of health and overall prognosis. She confirmed that he has been  declining for the past year. She agrees that his COPD is quite advanced. He has poor quality of life. He ambulates minimally at home and has been losing weight due to poor by mouth intake. She reports that he has said several times that he is "ready to die". Palliative care is following . Family meeting scheduled for later today.   DVT prophylaxis: lovenox Code Status: DNR Family Communication: discussed with wife at the bedside Disposition Plan: pending hospital course   Consultants:   Pulmonology  Palliative care  Procedures:     Antimicrobials:   Azithromycin 5/19>>   Subjective: Patient wore BiPAP overnight. He's been off for about an hour now is ready to go back on. Still feels very short of breath.  Objective: Vitals:   06/10/16 0600 06/10/16 0720 06/10/16 0745 06/10/16 0755  BP: 110/74     Pulse: 74 (!) 108    Resp: 11 19    Temp:  98.1 F (36.7 C)    TempSrc:  Oral    SpO2: 95% 93% 96% 97%  Weight:      Height:        Intake/Output Summary (Last 24 hours) at 06/10/16 0829 Last data filed at 06/10/16 0810  Gross per 24 hour  Intake              490 ml  Output             1050 ml  Net             -560 ml   Filed Weights   06-22-16 1811 06/08/16 0500 06/09/16 0457  Weight: 64.9 kg (143 lb 1.3 oz) 66.5 kg (146 lb 9.7 oz) 63.9 kg (140 lb 14 oz)    Examination:  General exam: Alert,  awake, oriented x 3, appears to be chronically ill Respiratory system: Clear to auscultation. Increased respiratory effort Cardiovascular system:RRR. No murmurs, rubs, gallops. Gastrointestinal system: Abdomen is nondistended, soft and nontender. No organomegaly or masses felt. Normal bowel sounds heard. Central nervous system: Alert and oriented. No focal neurological deficits. Extremities: No Edema bilaterally, +pedal pulses Skin: Mottling of skin and feet bilaterally. Pedal pulses intact. Psychiatry: Judgement and insight appear normal. Mood & affect appropriate.      Data Reviewed: I have personally reviewed following labs and imaging studies  CBC:  Recent Labs Lab 06/13/2016 1343 06/13/2016 1403 06/08/16 0452  WBC 7.0  --  3.5*  NEUTROABS 5.6  --   --   HGB 13.6 13.3 12.2*  HCT 40.4 39.0 35.9*  MCV 88.2  --  87.1  PLT 250  --  212   Basic Metabolic Panel:  Recent Labs Lab 06/13/2016 1343 06/13/2016 1403 06/08/16 0452 06/09/16 0501  NA 140 138 138 139  K 3.8 3.7 4.9 4.7  CL 97* 99* 100* 99*  CO2 35*  --  31 35*  GLUCOSE 105* 101* 162* 151*  BUN 15 15 12 16   CREATININE 0.91 1.00 0.85 0.90  CALCIUM 9.5  --  8.8* 9.0   GFR: Estimated Creatinine Clearance: 76.9 mL/min (by C-G formula based on SCr of 0.9 mg/dL). Liver Function Tests:  Recent Labs Lab 06/13/2016 1343  AST 22  ALT 14*  ALKPHOS 118  BILITOT 0.7  PROT 7.5  ALBUMIN 4.3    Recent Labs Lab 06/13/2016 1343  LIPASE 14   No results for input(s): AMMONIA in the last 168 hours. Coagulation Profile: No results for input(s): INR, PROTIME in the last 168 hours. Cardiac Enzymes: No results for input(s): CKTOTAL, CKMB, CKMBINDEX, TROPONINI in the last 168 hours. BNP (last 3 results) No results for input(s): PROBNP in the last 8760 hours. HbA1C: No results for input(s): HGBA1C in the last 72 hours. CBG:  Recent Labs Lab 06/09/16 0745 06/10/16 0719  GLUCAP 126* 120*   Lipid Profile: No results for input(s): CHOL, HDL, LDLCALC, TRIG, CHOLHDL, LDLDIRECT in the last 72 hours. Thyroid Function Tests:  Recent Labs  06/13/2016 1344  TSH 0.559   Anemia Panel: No results for input(s): VITAMINB12, FOLATE, FERRITIN, TIBC, IRON, RETICCTPCT in the last 72 hours. Sepsis Labs:  Recent Labs Lab 06/13/2016 1355  LATICACIDVEN 1.32    Recent Results (from the past 240 hour(s))  MRSA PCR Screening     Status: None   Collection Time: 06/13/2016  6:00 PM  Result Value Ref Range Status   MRSA by PCR NEGATIVE NEGATIVE Final    Comment:        The GeneXpert MRSA Assay  (FDA approved for NASAL specimens only), is one component of a comprehensive MRSA colonization surveillance program. It is not intended to diagnose MRSA infection nor to guide or monitor treatment for MRSA infections.          Radiology Studies: No results found.      Scheduled Meds: . enoxaparin (LOVENOX) injection  40 mg Subcutaneous Q24H  . famotidine  20 mg Oral Daily  . feeding supplement (ENSURE ENLIVE)  237 mL Oral BID BM  . ipratropium-albuterol  3 mL Nebulization Q6H  . methylPREDNISolone (SOLU-MEDROL) injection  60 mg Intravenous Q6H  . mometasone-formoterol  2 puff Inhalation BID  . multivitamin with minerals  1 tablet Oral Daily  . sodium chloride flush  3 mL Intravenous Q12H   Continuous Infusions: . azithromycin  Stopped (06/09/16 1854)     LOS: 3 days    Time spent:    Ruthell Feigenbaum, MD [facility] Pager [phone]  If 7PM-7AM, please contact night-coverage [email or web address] Password [facility] 06/10/2016, 8:29 AM

## 2016-06-10 NOTE — Progress Notes (Signed)
OT Cancellation Note  Patient Details Name: Roger DarterSteve Penkala MRN: [id number] DOB: 01/20/1956   Cancelled Treatment:      Reason Eval/Treat Not Completed: Spoke with PT this afternoon who reports palliative care has expressed that pt would not be able to tolerate rehab services, and requested to cancel orders.  Ezra SitesLeslie Troxler, OTR/L  [phone] 06/10/2016, 4:56 PM

## 2016-06-10 NOTE — Progress Notes (Signed)
Subjective: He remains short of breath with minimal exertion. He is using BiPAP most of the time. He's not feeding himself. He seems very weak. Palliative care consultation is underway and family meeting is scheduled for later today   Objective: Vital signs in last 24 hours: Temp:  [98.1 F (36.7 C)-98.3 F (36.8 C)] 98.1 F (36.7 C) (05/22 0720) Pulse Rate:  [68-115] 108 (05/22 0720) Resp:  [10-33] 19 (05/22 0720) BP: (100-170)/(65-92) 110/74 (05/22 0600) SpO2:  [93 %-98 %] 97 % (05/22 0755) FiO2 (%):  [35 %] 35 % (05/22 0436) Weight change:  Last BM Date: 06/13/2016  Intake/Output from previous day: 05/21 0701 - 05/22 0700 In: 250 [IV Piggyback:250] Out: 1050 [Urine:1050]  PHYSICAL EXAM General appearance: alert and moderate distress Resp: He has diminished breath sounds bilaterally. He is using an oxygen mask not BiPAP now Cardio: regular rate and rhythm, S1, S2 normal, no murmur, click, rub or gallop GI: soft, non-tender; bowel sounds normal; no masses,  no organomegaly Extremities: extremities normal, atraumatic, no cyanosis or edema Skin warm and dry  Lab Results:  Results for orders placed or performed during the hospital encounter of 05/22/2016 (from the past 48 hour(s))  Basic metabolic panel     Status: Abnormal   Collection Time: 06/09/16  5:01 AM  Result Value Ref Range   Sodium 139 135 - 145 mmol/L   Potassium 4.7 3.5 - 5.1 mmol/L   Chloride 99 (L) 101 - 111 mmol/L   CO2 35 (H) 22 - 32 mmol/L   Glucose, Bld 151 (H) 65 - 99 mg/dL   BUN 16 6 - 20 mg/dL   Creatinine, Ser 0.90 0.61 - 1.24 mg/dL   Calcium 9.0 8.9 - 10.3 mg/dL   GFR calc non Af Amer >60 >60 mL/min   GFR calc Af Amer >60 >60 mL/min    Comment: (NOTE) The eGFR has been calculated using the CKD EPI equation. This calculation has not been validated in all clinical situations. eGFR's persistently <60 mL/min signify possible Chronic Kidney Disease.    Anion gap 5 5 - 15  Glucose, capillary      Status: Abnormal   Collection Time: 06/09/16  7:45 AM  Result Value Ref Range   Glucose-Capillary 126 (H) 65 - 99 mg/dL  Glucose, capillary     Status: Abnormal   Collection Time: 06/10/16  7:19 AM  Result Value Ref Range   Glucose-Capillary 120 (H) 65 - 99 mg/dL    ABGS  Recent Labs  06/17/2016 1400 05/31/2016 1403  PHART 7.348*  --   PO2ART 129*  --   TCO2  --  32  HCO3 29.0*  --    CULTURES Recent Results (from the past 240 hour(s))  MRSA PCR Screening     Status: None   Collection Time: 05/20/2016  6:00 PM  Result Value Ref Range Status   MRSA by PCR NEGATIVE NEGATIVE Final    Comment:        The GeneXpert MRSA Assay (FDA approved for NASAL specimens only), is one component of a comprehensive MRSA colonization surveillance program. It is not intended to diagnose MRSA infection nor to guide or monitor treatment for MRSA infections.    Studies/Results: No results found.  Medications:  Prior to Admission:  Prescriptions Prior to Admission  Medication Sig Dispense Refill Last Dose  . budesonide-formoterol (SYMBICORT) 160-4.5 MCG/ACT inhaler Inhale 2 puffs into the lungs 2 (two) times daily. 1 Inhaler 12 06/10/2016 at Unknown time  . PROAIR  HFA 108 (90 BASE) MCG/ACT inhaler Inhale 2 puffs into the lungs every 6 (six) hours as needed. Shortness of breath,cough(rescue inhaler)  1 06/03/2016 at Unknown time   Scheduled: . enoxaparin (LOVENOX) injection  40 mg Subcutaneous Q24H  . famotidine  20 mg Oral Daily  . feeding supplement (ENSURE ENLIVE)  237 mL Oral BID BM  . ipratropium-albuterol  3 mL Nebulization Q6H  . methylPREDNISolone (SOLU-MEDROL) injection  60 mg Intravenous Q6H  . mometasone-formoterol  2 puff Inhalation BID  . multivitamin with minerals  1 tablet Oral Daily  . sodium chloride flush  3 mL Intravenous Q12H   Continuous: . azithromycin Stopped (06/09/16 1854)   KCC:QFJUVQQUI, guaiFENesin-dextromethorphan, hydrALAZINE, ibuprofen,  ipratropium-albuterol, morphine injection, ondansetron **OR** ondansetron (ZOFRAN) IV, polyethylene glycol  Assesment: He was admitted with COPD with acute exacerbation. He has acute on chronic hypoxic and hypercapnic respiratory failure. He has had acute metabolic encephalopathy. He has severe protein calorie malnutrition. He has failure to thrive. He is undergoing palliative medicine evaluation Principal Problem:   COPD with acute exacerbation ([place]) Active Problems:   Acute on chronic respiratory failure with hypercapnia (HCC)   Acute encephalopathy   Failure to thrive in adult   Acute on chronic respiratory failure with hypoxia and hypercapnia (HCC)   Descending aortic aneurysm [facility])   Palliative care encounter   Goals of care, counseling/discussion   DNR (do not resuscitate) discussion   Encounter for hospice care discussion   Protein-calorie malnutrition, severe    Plan: Continue treatments.    LOS: 3 days   Farid Grigorian L 06/10/2016, 8:04 AM

## 2016-06-10 NOTE — Progress Notes (Signed)
OT Cancellation Note  Patient Details Name: Roger Arnold MRN: [id number] DOB: 1955-08-01   Cancelled Treatment:     Reason evaluation not completed: Pt not medically appropriate. Pt HR 117 at rest, nsg reports he was just placed back on BiPAP before OT arrived. Will continue to follow and evaluate when appropriate.    Ezra SitesLeslie Aquilla Shambley, OTR/L  [phone] 06/10/2016, 9:17 AM

## 2016-06-10 NOTE — Progress Notes (Signed)
PT Cancellation Note  Patient Details Name: Roger DarterSteve Buck MRN: [id number] DOB: 1955-04-16   Cancelled Treatment:    Reason Eval/Treat Not Completed: Patient not medically ready (Attempted to see pt this AM, however he had just been placed back on BiPAP, and was tachycardic at rest with HR in the one-teens vs 120's.  Spoke with Palliative Care this PM who expressed that he would not be able to tolerate PT or OT, and requested to cancel orders. )   Waynetta SandyBeth Jahkai Yandell, PT, DPT X: [phone]

## 2016-06-10 NOTE — Progress Notes (Signed)
Daily Progress Note   Patient Name: Roger Arnold       Date: 06/10/2016 DOB: 10/08/1955  Age: 61 y.o. MRN#: [id number] Attending Physician: Erick Blinks, MD Primary Care Physician: Ladon Applebaum Admit Date: June 27, 2016  Reason for Consultation/Follow-up: Establishing goals of care, Hospice Evaluation and Psychosocial/spiritual support  Subjective: Family meeting in my office with wife Roger Arnold, mother Roger Arnold, and siblings Roger Arnold and Roger Arnold. There is another sibling, Roger Arnold, not present. We talk about Roger Arnold acute and chronic health problems. We talk about his functional decline over the last 6 months.  I share a diagram of the chronic illness pathway, what is normal and expected.  We talk about what's important to Roger Arnold. Family states that he had previously stated he wanted to go [place], sit down with a beer and just die. Mother Roger Arnold states that one month ago they had a discussion where Roger Arnold told her that he was "ready to go". His brother Roger Arnold states that Roger Arnold had said "when I can't help myself I'm ready to go". Family shares Roger Arnold struggle and even the most basic activities.   We talk about prognosis (with everyone's agreement) possibly hours today's without BiPAP (life support). We talk about a possible hospital death, or the possibility of transfer to hospice home if Roger Arnold is stable enough.  Roger Arnold states that she see Roger Arnold growing dependence on BiPAP. I share that often people with end-stage COPD have trouble breathing and this type of support eases their breathing. Family states that they would like to unburden Roger Arnold from BiPAP, they see the inevitable, that he is dying. Sister Roger Arnold request that we asked Roger Arnold what he does and  does not want. We talk about limiting visitors in the room, as there have been anywhere from 8+ visitors at a time. Family agrees to limit visitors and that PMT will discuss symptom management and BiPAP used with Roger Arnold with only his wife and mother present.  Roger Arnold is able to communicate through head movements. He makes it known that he is not comfortable without BiPAP at this time. We discussed the use of morphine continuous infusion to make his breathing as comfortable as possible. He agrees to the use of continuous infusions of morphine for dyspnea symptom  management. We discuss unburdening him from BiPAP if/when he is comfortable with his breathing. He agrees. Family is at bedside, in agreement with plan. Wife Roger Arnold and mother Roger Arnold state they see Roger Arnold suffering and don't want him to have to suffer any longer. Roger Arnold and Roger Arnold understand that without BiPAP Roger Arnold will likely only have hours to days left to live.  Length of Stay: 3  Current Medications: Scheduled Meds:  . famotidine  20 mg Oral Daily  . feeding supplement (ENSURE ENLIVE)  237 mL Oral BID BM  . ipratropium-albuterol  3 mL Nebulization Q6H  . methylPREDNISolone (SOLU-MEDROL) injection  60 mg Intravenous Q6H  . mometasone-formoterol  2 puff Inhalation BID  . sodium chloride flush  3 mL Intravenous Q12H    Continuous Infusions: . azithromycin Stopped (06/09/16 1854)  . morphine 2 mg/hr (06/10/16 1318)    PRN Meds: bisacodyl, bisacodyl, guaiFENesin-dextromethorphan, hydrALAZINE, ibuprofen, ipratropium-albuterol, ondansetron **OR** ondansetron (ZOFRAN) IV  Physical Exam  Constitutional: No distress.  Acutely ill appearing, unable to speak due to respiratory status  HENT:  Head: Atraumatic.  Cardiovascular: Regular rhythm.   Rate 110's  Pulmonary/Chest: He is in respiratory distress.  Unable to speak due to respiratory status  Abdominal: Soft. He exhibits no distension.  Musculoskeletal: He  exhibits no edema.  Neurological: He is alert.  Unable to determine orientation  Skin: Skin is warm and dry.  Nursing note and vitals reviewed.           Vital Signs: BP (!) 175/85   Pulse (!) 109   Temp 97.7 F (36.5 C) (Axillary)   Resp (!) 24   Ht 5\' 9"  (1.753 m)   Wt 63.9 kg (140 lb 14 oz)   SpO2 93%   BMI 20.80 kg/m  SpO2: SpO2: 93 % O2 Device: O2 Device: Nasal Cannula O2 Flow Rate: O2 Flow Rate (L/min): 4 L/min  Intake/output summary:  Intake/Output Summary (Last 24 hours) at 06/10/16 1406 Last data filed at 06/10/16 0810  Gross per 24 hour  Intake              490 ml  Output             1050 ml  Net             -560 ml   LBM: Last BM Date: 06/06/2016 Baseline Weight: Weight: 69.4 kg (153 lb) Most recent weight: Weight: 63.9 kg (140 lb 14 oz)       Palliative Assessment/Data:    Flowsheet Rows     Most Recent Value  Intake Tab  Referral Department  Hospitalist  Unit at Time of Referral  ICU  Palliative Care Primary Diagnosis  Pulmonary  Date Notified  06/08/16  Palliative Care Type  New Palliative care  Reason for referral  Clarify Goals of Care  Date of Admission  05/21/2016  Date first seen by Palliative Care  06/09/16  # of days Palliative referral response time  1 Day(s)  # of days IP prior to Palliative referral  1  Clinical Assessment  Palliative Performance Scale Score  20%  Pain Max last 24 hours  Not able to report  Pain Min Last 24 hours  Not able to report  Dyspnea Max Last 24 Hours  Not able to report  Dyspnea Min Last 24 hours  Not able to report  Psychosocial & Spiritual Assessment  Palliative Care Outcomes  Patient/Family meeting held?  Yes  Who was at the meeting?  With wife  [facility]  Palliative Care Outcomes  Changed CPR status, Provided psychosocial or spiritual support, Clarified goals of care, Counseled regarding hospice, Provided advance care planning  Patient/Family wishes: Interventions discontinued/not started   Mechanical  Ventilation, PEG      Patient Active Problem List   Diagnosis Date Noted  . Protein-calorie malnutrition, severe 06/09/2016  . Palliative care encounter   . Goals of care, counseling/discussion   . DNR (do not resuscitate) discussion   . Encounter for hospice care discussion   . Descending aortic aneurysm (HCC) 06/08/2016  . Acute encephalopathy 05-Jul-2016  . COPD with acute exacerbation (HCC) Jul 05, 2016  . Failure to thrive in adult July 05, 2016  . Acute on chronic respiratory failure with hypoxia and hypercapnia (HCC)   . Acute on chronic respiratory failure with hypercapnia (HCC)   . Tachycardia 10/02/2014    Palliative Care Assessment & Plan   Patient Profile: 61 y.o. male  with past medical history of Crippling arthritis, back pain, end-stage COPD admitted on 07/05/2016 with COPD acute exacerbation with respiratory failure.   Assessment: COPD exacerbation with respiratory failure; Mr. Malerba is able to communicate through head movements. He makes it known that he is not comfortable without BiPAP at this time. We discussed the use of morphine continuous infusion to make his breathing is comfortable as possible. He agrees to the use of continuous infusions of morphine for dyspnea symptom management. We discuss unburdening him from BiPAP if/when he is comfortable with his breathing. He agrees. Family is at bedside in agreement with plan.  Recommendations/Plan:  Roger Arnold, his wife Roger Arnold, and his mother Roger Arnold are in agreement to start continuous infusion of morphine for dyspnea management. The goal is to be able to unburdening Roger Arnold from BiPAP use. Roger Arnold and Roger Arnold understand that without BiPAP Roger Arnold will likely only have days left to live.  Goals of Care and Additional Recommendations:  Limitations on Scope of Treatment: No CPR, no intubation, okay for BiPAP at this time.  Code Status:    Code Status Orders        Start     Ordered   06/09/16 1218  Do not  attempt resuscitation (DNR)  Continuous    Question Answer Comment  In the event of cardiac or respiratory ARREST Do not call a "code blue"   In the event of cardiac or respiratory ARREST Do not perform Intubation, CPR, defibrillation or ACLS   In the event of cardiac or respiratory ARREST Use medication by any route, position, wound care, and other measures to relive pain and suffering. May use oxygen, suction and manual treatment of airway obstruction as needed for comfort.      06/09/16 1218    Code Status History    Date Active Date Inactive Code Status Order ID Comments User Context   06/08/2016  9:58 AM 06/09/2016 12:18 PM Partial Code [id number]  Erick Blinks, MD Inpatient   July 05, 2016  5:24 PM 06/08/2016  9:58 AM Partial Code [id number]  Briscoe Deutscher, MD ED   10/02/2014 11:31 AM 10/04/2014  5:30 PM Full Code [id number]  Gwenyth Bender, NP Inpatient       Prognosis:   < 2 weeks would not be surprising if no improvement.  Discharge Planning:  To be determined, and Hospital death would not be surprising, also transition to hospice home, if Mr. Stief is able, is an option.  Care plan was discussed with nursing staff, case manager, social worker, and Dr. Kerry Hough.   Thank you  for allowing the Palliative Medicine Team to assist in the care of this patient.   Time In: 1130 Time Out: 1230 Total Time 60 minutes Prolonged Time Billed  yes       Greater than 50%  of this time was spent counseling and coordinating care related to the above assessment and plan.  Katheran Aweasha A Tahlor Berenguer, NP  Please contact Palliative Medicine Team phone at [phone] for questions and concerns.

## 2016-06-11 DIAGNOSIS — J441 Chronic obstructive pulmonary disease with (acute) exacerbation: Principal | ICD-10-CM

## 2016-06-11 LAB — GLUCOSE, CAPILLARY: Glucose-Capillary: 149 mg/dL — ABNORMAL HIGH (ref 65–99)

## 2016-06-11 NOTE — Progress Notes (Signed)
Pt O2 sats decreasing into upper 70s. 77-79. Transitioned to [place] at 10lpm. Discussed with patients family at bedside and still decline the option of BiPAP. Pt resting comfortably and is in no visible respiratory distress.

## 2016-06-11 NOTE — Progress Notes (Signed)
Morphine is now at 3 mg./hr. No signs of discomfort.

## 2016-06-11 NOTE — Care Management Note (Addendum)
Case Management Note  Patient Details  Name: Roger DarterSteve Arnold MRN: [id number] Date of Birth: 1955/02/19  Expected Discharge Date:  01-19-17               Expected Discharge Plan:     In-House Referral:     Discharge planning Services  CM Consult  Post Acute Care Choice:    Choice offered to:     DME Arranged:    DME Agency:     HH Arranged:    HH Agency:     Status of Service:  In process, will continue to follow  If discussed at Long Length of Stay Meetings, dates discussed:    Additional Comments: CM following for needs. Progress notes  read in regards to unresponsiveness this morning.  Samyuktha Brau, Chrystine OilerSharley Diane, RN 06/11/2016, 10:13 AM

## 2016-06-11 NOTE — Progress Notes (Signed)
Daily Progress Note   Patient Name: Roger Arnold       Date: 06/11/2016 DOB: April 25, 1955  Age: 61 y.o. MRN#: [id number] Attending Physician: Shon Hale, MD Primary Care Physician: Ladon Applebaum Admit Date: 06/01/2016  Reason for Consultation/Follow-up: Establishing goals of care, Psychosocial/spiritual support and Terminal Care  Subjective: Roger Arnold is lying quietly in bed. He does not open his eyes or try to communicate with me in any way. He seems comfortable on a continuous infusion of morphine at this time. Present today at bedside is wife and 5 adult children, mother, and extended family. Wife Harriett Sine states that her goal is for their pastor to come pray with him and that she, and their 5 children alone, stay at Roger Arnold bedside until he passes. She shares her gratitude for the care that the staff has provided. Conference with nursing staff related care conference with hospitalist related to care.  Length of Stay: 4  Current Medications: Scheduled Meds:  . famotidine  20 mg Oral Daily  . feeding supplement (ENSURE ENLIVE)  237 mL Oral BID BM  . ipratropium-albuterol  3 mL Nebulization Q6H  . methylPREDNISolone (SOLU-MEDROL) injection  60 mg Intravenous Q6H  . mometasone-formoterol  2 puff Inhalation BID  . sodium chloride flush  3 mL Intravenous Q12H    Continuous Infusions: . morphine 2 mg/hr (06/11/16 0200)    PRN Meds: bisacodyl, bisacodyl, guaiFENesin-dextromethorphan, hydrALAZINE, ibuprofen, ipratropium-albuterol, ondansetron **OR** ondansetron (ZOFRAN) IV  Physical Exam  Constitutional: No distress.  Does not open eyes to command or touch, unresponsive  HENT:  Head: Normocephalic and atraumatic.  Cardiovascular: Normal rate and regular rhythm.   Rate  in the 80s to 110's  Pulmonary/Chest:  No work of breathing noted, nasal cannula, morphine infusion for comfort  Abdominal: Soft. He exhibits no distension.  Musculoskeletal: He exhibits no edema.  Neurological:  Unresponsive  Skin: Skin is warm and dry.  Nursing note and vitals reviewed.           Vital Signs: BP 91/64   Pulse (!) 116   Temp 98.2 F (36.8 C) (Oral)   Resp 10   Ht 5\' 9"  (1.753 m)   Wt 62.9 kg (138 lb 10.7 oz)   SpO2 (!) 79%   BMI 20.48 kg/m  SpO2: SpO2: (!) 79 % O2  Device: O2 Device: High Flow Nasal Cannula ([place]) O2 Flow Rate: O2 Flow Rate (L/min): 10 L/min  Intake/output summary:  Intake/Output Summary (Last 24 hours) at 06/11/16 1201 Last data filed at 06/11/16 1000  Gross per 24 hour  Intake            50.94 ml  Output             1100 ml  Net         -1049.06 ml   LBM: Last BM Date: June 29, 2016 Baseline Weight: Weight: 69.4 kg (153 lb) Most recent weight: Weight: 62.9 kg (138 lb 10.7 oz)       Palliative Assessment/Data:    Flowsheet Rows     Most Recent Value  Intake Tab  Referral Department  Hospitalist  Unit at Time of Referral  ICU  Palliative Care Primary Diagnosis  Pulmonary  Date Notified  06/08/16  Palliative Care Type  New Palliative care  Reason for referral  Clarify Goals of Care  Date of Admission  06-29-16  Date first seen by Palliative Care  06/09/16  # of days Palliative referral response time  1 Day(s)  # of days IP prior to Palliative referral  1  Clinical Assessment  Palliative Performance Scale Score  20%  Pain Max last 24 hours  Not able to report  Pain Min Last 24 hours  Not able to report  Dyspnea Max Last 24 Hours  Not able to report  Dyspnea Min Last 24 hours  Not able to report  Psychosocial & Spiritual Assessment  Palliative Care Outcomes  Patient/Family meeting held?  Yes  Who was at the meeting?  With wife [place]  Palliative Care Outcomes  Changed CPR status, Provided psychosocial or spiritual support,  Clarified goals of care, Counseled regarding hospice, Provided advance care planning  Patient/Family wishes: Interventions discontinued/not started   Mechanical Ventilation, PEG      Patient Active Problem List   Diagnosis Date Noted  . Protein-calorie malnutrition, severe 06/09/2016  . Palliative care encounter   . Goals of care, counseling/discussion   . DNR (do not resuscitate) discussion   . Encounter for hospice care discussion   . Descending aortic aneurysm (HCC) 06/08/2016  . Acute encephalopathy June 29, 2016  . COPD with acute exacerbation (HCC) 06-29-2016  . Failure to thrive in adult 06/29/16  . Acute on chronic respiratory failure with hypoxia and hypercapnia (HCC)   . Acute on chronic respiratory failure with hypercapnia (HCC)   . Tachycardia 10/02/2014    Palliative Care Assessment & Plan   Patient Profile: 61 y.o.malewith past medical history of Crippling arthritis, back pain, end-stage COPDadmitted on 06-10-2018with COPD acute exacerbation with respiratory failure.   Assessment: COPD exacerbation with respiratory failure; Roger Arnold is able to communicate through head movements. He makes it known that he is not comfortable without BiPAP at this time. We discussed the use of morphine continuous infusion to make his breathing is comfortable as possible. He agrees to the use of continuous infusions of morphine for dyspnea symptom management. We discuss unburdening him from BiPAP if/when he is comfortable with his breathing. He agrees. Family is at bedside in agreement with plan. He has not made use of the BiPAP since yesterday, he appears comfortable at this time, is actively dying.  Recommendations/Plan:  full comfort measures at this time, continue morphine infusion with bolus and increase as needed.  Goals of Care and Additional Recommendations:  Limitations on Scope of Treatment: Full Comfort Care  Code  Status:    Code Status Orders        Start      Ordered   06/09/16 1218  Do not attempt resuscitation (DNR)  Continuous    Question Answer Comment  In the event of cardiac or respiratory ARREST Do not call a "code blue"   In the event of cardiac or respiratory ARREST Do not perform Intubation, CPR, defibrillation or ACLS   In the event of cardiac or respiratory ARREST Use medication by any route, position, wound care, and other measures to relive pain and suffering. May use oxygen, suction and manual treatment of airway obstruction as needed for comfort.      06/09/16 1218    Code Status History    Date Active Date Inactive Code Status Order ID Comments User Context   06/08/2016  9:58 AM 06/09/2016 12:18 PM Partial Code [id number]  Erick BlinksMemon, Jehanzeb, MD Inpatient   06/02/2016  5:24 PM 06/08/2016  9:58 AM Partial Code [id number]  Briscoe Deutscherpyd, Timothy S, MD ED   10/02/2014 11:31 AM 10/04/2014  5:30 PM Full Code [id number]  Black, Lesle ChrisKaren M, NP Inpatient       Prognosis:   Hours - Days  Discharge Planning:  Anticipated Hospital Death  Care plan was discussed with nursing staff, case manager, and Dr. Mariea ClontsEmokpae.   Thank you for allowing the Palliative Medicine Team to assist in the care of this patient.   Time In: 0910 Time Out: 0950 Total Time 40 minutes Prolonged Time Billed  no       Greater than 50%  of this time was spent counseling and coordinating care related to the above assessment and plan.  Katheran Aweasha A Dove, NP  Please contact Palliative Medicine Team phone at [phone] for questions and concerns.

## 2016-06-11 NOTE — Progress Notes (Signed)
Patient Demographics:    Roger Arnold, is a 61 y.o. male, DOB - 10/03/1955, ZOX:[id number]  Admit date - July 05, 2016   Admitting Physician Briscoe Deutscher, MD  Outpatient Primary MD for the patient is Ladon Applebaum  LOS - 4   Chief Complaint  Patient presents with  . Shortness of Breath        Subjective:    Roger Arnold today has no fevers, no emesis,  patient is hypoxic, tachycardic and unresponsive.   Assessment  & Plan :    Principal Problem:   COPD with acute exacerbation (HCC) Active Problems:   Acute on chronic respiratory failure with hypercapnia (HCC)   Acute encephalopathy   Failure to thrive in adult   Acute on chronic respiratory failure with hypoxia and hypercapnia (HCC)   Descending aortic aneurysm [facility])   Palliative care encounter   Goals of care, counseling/discussion   DNR (do not resuscitate) discussion   Encounter for hospice care discussion   Protein-calorie malnutrition, severe  61 year old male with a history of oxygen dependent/steroid dependent COPD presented with worsening shortness of breath and confusion. Found to have elevated PCO2 and COPD exacerbation. Admitted to the stepdown unit on BiPAP. Pulmonology And Palliative consult appreciated   Plan:- 1)Acute on Chronic hypoxic and hypercapnic respiratory failure- at this time family declines BiPAP, patient is hypoxic, tachycardic and unresponsive.  comfort measures only  2)Social/Ethics- patient's wife and kids at bedside, RN Lawrence at bedside, case also discussed with patient though from palliative care services in the unit. Patient is a DNR/DNI. Patient is actively dying. Continue comfort measures and oxygen supplementation    Code Status : DNR   Disposition Plan  : Actively dying  Consults  :  Pulmonology and palliative care   Lab Results  Component Value Date   PLT 212 06/08/2016     Inpatient Medications  Scheduled Meds: . famotidine  20 mg Oral Daily  . feeding supplement (ENSURE ENLIVE)  237 mL Oral BID BM  . ipratropium-albuterol  3 mL Nebulization Q6H  . methylPREDNISolone (SOLU-MEDROL) injection  60 mg Intravenous Q6H  . mometasone-formoterol  2 puff Inhalation BID  . sodium chloride flush  3 mL Intravenous Q12H   Continuous Infusions: . morphine 2 mg/hr (06/11/16 0200)   PRN Meds:.bisacodyl, bisacodyl, guaiFENesin-dextromethorphan, hydrALAZINE, ibuprofen, ipratropium-albuterol, ondansetron **OR** ondansetron (ZOFRAN) IV    Anti-infectives    Start     Dose/Rate Route Frequency Ordered Stop   2016/07/05 1730  azithromycin (ZITHROMAX) 500 mg in dextrose 5 % 250 mL IVPB  Status:  Discontinued     500 mg 250 mL/hr over 60 Minutes Intravenous Every 24 hours 07-05-16 1724 06/10/16 1705        Objective:   Vitals:   06/11/16 0700 06/11/16 0800 06/11/16 0900 06/11/16 1000  BP: (!) 114/55 (!) 109/56 (!) 99/54 91/64  Pulse: (!) 125 (!) 120 (!) 120 (!) 116  Resp: 12 10 13 10   Temp:      TempSrc:      SpO2: (!) 78% (!) 74% (!) 73% (!) 79%  Weight:      Height:        Wt Readings from Last 3 Encounters:  06/11/16 62.9 kg (138 lb 10.7 oz)  10/04/14 69.5 kg (153 lb 4.8 oz)     Intake/Output Summary (Last 24 hours) at 06/11/16 1309 Last data filed at 06/11/16 1000  Gross per 24 hour  Intake            50.94 ml  Output             1100 ml  Net         -1049.06 ml     Physical Exam  Gen:- Unresponsive, no distress HEENT:- [place]-high flow oxygen  Neck-Supple Neck,No JVD,.  Lungs- diminished bilaterally  CV- S1, S2 normal, tachycardic Abd-  +ve B.Sounds, Abd Soft,  GU-condom catheter without significant urine output   Data Review:   Micro Results Recent Results (from the past 240 hour(s))  MRSA PCR Screening     Status: None   Collection Time: 07/04/16  6:00 PM  Result Value Ref Range Status   MRSA by PCR NEGATIVE NEGATIVE Final     Comment:        The GeneXpert MRSA Assay (FDA approved for NASAL specimens only), is one component of a comprehensive MRSA colonization surveillance program. It is not intended to diagnose MRSA infection nor to guide or monitor treatment for MRSA infections.     Radiology Reports Ct Head Wo Contrast  Result Date: 07-04-16 CLINICAL DATA:  Altered mental status and hypoxia. Lethargic. Loss of appetite 1 year. Difficulty breathing today. EXAM: CT HEAD WITHOUT CONTRAST TECHNIQUE: Contiguous axial images were obtained from the base of the skull through the vertex without intravenous contrast. COMPARISON:  None. FINDINGS: Brain: Ventricles, cisterns and other CSF spaces are within normal. There is no mass, mass effect, shift of midline structures or acute hemorrhage. No evidence to suggest acute infarction. Mild chronic ischemic microvascular disease. Vascular: No hyperdense vessel or unexpected calcification. Skull: Within normal. Sinuses/Orbits: Orbits are normal. Mild mucosal membrane thickening over the left frontal sinus and frontoethmoidal recess as well as mild opacification over the ethmoid air cells and right maxillary sinus. Mastoid air cells are clear. Other: None. IMPRESSION: No acute intracranial findings. Mild chronic ischemic microvascular disease. Mild chronic sinus inflammatory change. Electronically Signed   By: Elberta Fortis M.D.   On: July 04, 2016 16:00   Ct Abdomen Pelvis W Contrast  Result Date: 2016-07-04 CLINICAL DATA:  The patient is lethargic with abdominal pain and loss of appetite. EXAM: CT ABDOMEN AND PELVIS WITH CONTRAST TECHNIQUE: Multidetector CT imaging of the abdomen and pelvis was performed using the standard protocol following bolus administration of intravenous contrast. CONTRAST:  ISOVUE-300 IOPAMIDOL (ISOVUE-300) INJECTION 61% COMPARISON:  None. FINDINGS: Lower chest: The descending thoracic aorta measures up to 3.9 cm on series 2, image 1. The lung bases  are otherwise unremarkable. Hepatobiliary: Cholelithiasis is identified in a mildly distended gallbladder. A probable tiny cyst is seen in the left hepatic lobe on axial image 11. This is too small to completely characterize. The liver and portal vein are otherwise normal. Pancreas: Unremarkable. No pancreatic ductal dilatation or surrounding inflammatory changes. Spleen: Normal in size without focal abnormality. Adrenals/Urinary Tract: There is a cyst in the right kidney. 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 The lower pole moiety is atrophic. An extrarenal pelvis associated with the lower pole moiety is mildly prominent. No hydronephrosis. The distal right ureter is somewhat thickened as seen on coronal image 75. High attenuation foci just lateral to the right UVJ likely represent tiny previously passed stones. There is a small diverticulum associated with the  lateral right bladder containing a high attenuation focus as seen on coronal image 70. The ureters and bladder are otherwise unremarkable. Stomach/Bowel: The stomach and small bowel are normal. There is a rounded defect along the left lateral wall of the rectum as seen on coronal image 81 and axial image 62 measuring 1.5 cm. Scattered colonic diverticuli are seen without diverticulitis. Remainder of the colon is unremarkable. The appendix is unremarkable as well. Vascular/Lymphatic: The proximal abdominal aorta measures 3.65 cm on series 2, image 8. There is atherosclerosis throughout the aorta. No dissection. The remainder of the aorta is normal in caliber. No adenopathy identified. Reproductive: Prostate is unremarkable. Other: No free air or free fluid. Evaluation of the upper to mid abdomen is limited by motion. Musculoskeletal: No acute or significant osseous findings. IMPRESSION: 1. Evaluation of the upper abdomen is limited due to respiratory motion. 2. 15 mm filling defect in the left lateral aspect of the rectum. This  could represent adherent stool but a polyp or mass is not excluded. Recommend direct visualization. 3. The distal right ureter appears thickened, best seen on coronal images. This is an age-indeterminate finding of uncertain etiology. There are a few tiny stones in the bladder, just lateral to the UVJ which are consistent with previously passed stones of uncertain chronicity. There is a bladder diverticulum containing a tiny stone. 4. Multiple stones are seen in the mildly distended gallbladder with no wall thickening. An ultrasound could better evaluate the gallbladder. 5. The descending thoracic aorta measures 3.9 cm and the proximal abdominal aortic measures 3.65 cm. Recommend followup by ultrasound in 2 years. This recommendation follows ACR consensus guidelines: White Paper of the ACR Incidental Findings Committee II on Vascular Findings. J Am Coll Radiol 2013; 10:789-794. Electronically Signed   By: Gerome Sam III M.D   On: Jun 17, 2016 16:14   Dg Chest Portable 1 View  Result Date: 2016/06/17 CLINICAL DATA:  Shortness of breath and altered mental status. EXAM: PORTABLE CHEST 1 VIEW COMPARISON:  09/21/2014 FINDINGS: Lungs are hyperexpanded without focal consolidation or effusion. Mild stable tenting of the left hemidiaphragm. Cardiac silhouette is within normal. Minimal calcification and tortuosity of the thoracic aorta. Remainder of the exam is unchanged. IMPRESSION: No acute cardiopulmonary disease. COPD. Aortic atherosclerosis. Electronically Signed   By: Elberta Fortis M.D.   On: 17-Jun-2016 14:02     CBC  Recent Labs Lab 2016-06-17 1343 2016-06-17 1403 06/08/16 0452  WBC 7.0  --  3.5*  HGB 13.6 13.3 12.2*  HCT 40.4 39.0 35.9*  PLT 250  --  212  MCV 88.2  --  87.1  MCH 29.7  --  29.6  MCHC 33.7  --  34.0  RDW 13.6  --  13.2  LYMPHSABS 0.5*  --   --   MONOABS 0.8  --   --   EOSABS 0.1  --   --   BASOSABS 0.0  --   --     Chemistries   Recent Labs Lab Jun 17, 2016 1343 06/17/16 1403  06/08/16 0452 06/09/16 0501  NA 140 138 138 139  K 3.8 3.7 4.9 4.7  CL 97* 99* 100* 99*  CO2 35*  --  31 35*  GLUCOSE 105* 101* 162* 151*  BUN 15 15 12 16   CREATININE 0.91 1.00 0.85 0.90  CALCIUM 9.5  --  8.8* 9.0  AST 22  --   --   --   ALT 14*  --   --   --   [facility]  118  --   --   --   BILITOT 0.7  --   --   --    ------------------------------------------------------------------------------------------------------------------ No results for input(s): CHOL, HDL, LDLCALC, TRIG, CHOLHDL, LDLDIRECT in the last 72 hours.  No results found for: HGBA1C ------------------------------------------------------------------------------------------------------------------ No results for input(s): TSH, T4TOTAL, T3FREE, THYROIDAB in the last 72 hours.  Invalid input(s): FREET3 ------------------------------------------------------------------------------------------------------------------ No results for input(s): VITAMINB12, FOLATE, FERRITIN, TIBC, IRON, RETICCTPCT in the last 72 hours.  Coagulation profile No results for input(s): INR, PROTIME in the last 168 hours.  No results for input(s): DDIMER in the last 72 hours.  Cardiac Enzymes No results for input(s): CKMB, TROPONINI, MYOGLOBIN in the last 168 hours.  Invalid input(s): CK ------------------------------------------------------------------------------------------------------------------    Component Value Date/Time   BNP 55.0 09-Nov-2016 1344     Crist Kruszka M.D on 06/11/2016 at 1:09 PM  Between 7am to 7pm - Pager - [phone]  After 7pm go to [email or web address] - password [email or web address]  [facility] -  Office  [phone]   Voice Recognition Reubin Milan/Dragon dictation system was used to create this note, attempts have been made to correct errors. Please contact the author with questions and/or clarifications.

## 2016-06-11 NOTE — Progress Notes (Signed)
He is unresponsive tachycardic hypoxic on high flow nasal oxygen and appears to be actively dying. I don't think he'll survive the day. I will plan to sign off. Thanks for allowing me to see him with you

## 2016-06-20 NOTE — Progress Notes (Signed)
Patient transferred to morgue at this time.  Post mortem care eye care provided.  Patient placement notified.

## 2016-06-20 NOTE — Progress Notes (Signed)
Morphine gtt wasted down sink (100ml) with second nurse verification. Witness via Fatima SangerFred Larrick RN and Chinita GreenlandJames Daniels RN

## 2016-06-20 NOTE — Progress Notes (Addendum)
In to see patient at this time, patient noted to have slowing heart rate.  Patient in asystole at this time. Two nurse verification of no heart tones auscultated via two RN's. Chinita Greenland(James Daniels RN, Fatima SangerFred Wilz RN).  AC house supervisor notified at this time. MD notified at this time. Death certificate to be signed via Dr. Nedra HaiLee. TOD 0159. [facility] called at this time Junie Bame(Karin Lavu reference number [id number]), patient potential for tissue and eye organ donation.  Eye prep to be conducted during post mortem care once placed in morgue. Family currently at bedside, will get funeral arrangements and notify bed placement at this time.

## 2016-06-20 NOTE — Discharge Summary (Signed)
Roger Arnold, is a 61 y.o. male  DOB 1955/11/02  MRN [id number].  Admission date:  05/24/2016  Admitting Physician  Briscoe Deutscher, MD  Discharge Date:  2016-07-12   Primary MD  Avis Epley, PA-C   Admission Diagnosis  COPD exacerbation [facility]) [J44.1] Altered mental status, unspecified altered mental status type [R41.82] Acute on chronic respiratory failure with hypoxia and hypercapnia (HCC) [J96.21, J96.22]   Discharge Diagnosis  COPD exacerbation (HCC) [J44.1] Altered mental status, unspecified altered mental status type [R41.82] Acute on chronic respiratory failure with hypoxia and hypercapnia (HCC) [J96.21, J96.22]    Principal Problem:   COPD with acute exacerbation (HCC) Active Problems:   Acute on chronic respiratory failure with hypercapnia (HCC)   Acute encephalopathy   Failure to thrive in adult   Acute on chronic respiratory failure with hypoxia and hypercapnia (HCC)   Descending aortic aneurysm [facility])   Palliative care encounter   Goals of care, counseling/discussion   DNR (do not resuscitate) discussion   Encounter for hospice care discussion   Protein-calorie malnutrition, severe      Past Medical History:  Diagnosis Date  . Arthritis   . Back pain   . COPD (chronic obstructive pulmonary disease) (HCC)     History reviewed. No pertinent surgical history.     HPI  from the history and physical done on the day of admission:    Chief Complaint: SOB, confusion   HPI: Roger Arnold is a 61 y.o. male with medical history significant for tobacco abuse and COPD with chronic respiratory failure requiring 2 L/m of supplemental oxygen at baseline, now presenting to the emergency department for evaluation of respiratory distress and confusion. History is provided by patient's wife, report of EMS, review of the EMR, and from the patient himself. Patient's wife reports that Roger Arnold  has not seen a physician in at least a year and has not used any of his medications aside from albuterol over that same interval. He has not bathed in a year and will not eat or drink anything and less his wife hands it to him. He has not very active, and does have chronic dyspnea and cough, but had been seemingly stable until today when his wife attempted to bathe him. She describes deciding that she would make him bathe today, but the patient became severely dyspneic and confused when she was trying to bathe him. EMS was called out and found the patient to be saturating 75% on his 2 L/m of supplemental oxygen. Recent drug or alcohol use is denied and the patient denies chest pain or palpitations. He also denied headache, change in vision or hearing, or focal numbness or weakness. He was placed on increased FiO2 and transported to the hospital.  ED Course: Upon arrival to the ED, patient is found to be afebrile, saturating well on BiPAP, tachypneic, mildly tachycardic, and with stable blood pressure. EKG features sinus tachycardia with rate 112, PVCs, and RBBB. Chest x-ray features changes consistent with COPD, but no acute cardiopulmonary  disease. Noncontrast head CT is negative for acute intracranial abnormality. CT of abdomen and pelvis feature some nonspecific incidental findings, but no acute intra-abdominal or pelvic process. Chemistry panel reveals a bicarbonate of 35. CBC is within the normal limits, the urinalysis is unremarkable, lactic acid is reassuring at 1.32, and troponin and BNP are both normal. Ethanol level was undetectable. D-dimer was 0.59. Patient was given 500 mL of normal saline, DuoNeb, and 125 mg IV Solu-Medrol in the ED. His mentation improved considerably after being on BiPAP and he was transitioned to nasal cannula. He became more drowsy shortly after transition to nasal cannula and he was placed back on BiPAP. He has remained hemodynamically stable. He will be admitted to the  stepdown unit for ongoing evaluation and management of acute on chronic hypercarbic respiratory failure with acute encephalopathy, likely secondary to acute exacerbation and COPD with CO2 narcosis.     Hospital Course:     61 year old male with a history of oxygen dependent/steroid dependent COPD presented with worsening shortness of breath and confusion. Found to have elevated PCO2 and COPD exacerbation. Admitted to the stepdown unit on BiPAP. Pulmonology And Palliative consult appreciated .  Plan:- 1)Acute on Chronic hypoxic and hypercapnic respiratory failure-  Due to severe persistent symptoms and overall poor prognosis palliative care consult was obtained. Patient was made DNR/DNI and comfort measures only . Patient expired at 0159 on 07/09/2016.   2)Social/Ethics- please see full progress note dated 06/11/2016  Discharge Condition: expired     Consults obtained - pulm and palliative     Discharge Medications     Allergies as of 07-09-2016      Reactions   Percocet [oxycodone-acetaminophen] Rash, Other (See Comments)   Has cold sweats      Medication List    ASK your doctor about these medications   budesonide-formoterol 160-4.5 MCG/ACT inhaler Commonly known as:  SYMBICORT Inhale 2 puffs into the lungs 2 (two) times daily.   PROAIR HFA 108 (90 Base) MCG/ACT inhaler Generic drug:  albuterol Inhale 2 puffs into the lungs every 6 (six) hours as needed. Shortness of breath,cough(rescue inhaler)       Major procedures and Radiology Reports - PLEASE review detailed and final reports for all details, in brief -   Ct Head Wo Contrast  Result Date: 06/11/2016 CLINICAL DATA:  Altered mental status and hypoxia. Lethargic. Loss of appetite 1 year. Difficulty breathing today. EXAM: CT HEAD WITHOUT CONTRAST TECHNIQUE: Contiguous axial images were obtained from the base of the skull through the vertex without intravenous contrast. COMPARISON:  None. FINDINGS: Brain:  Ventricles, cisterns and other CSF spaces are within normal. There is no mass, mass effect, shift of midline structures or acute hemorrhage. No evidence to suggest acute infarction. Mild chronic ischemic microvascular disease. Vascular: No hyperdense vessel or unexpected calcification. Skull: Within normal. Sinuses/Orbits: Orbits are normal. Mild mucosal membrane thickening over the left frontal sinus and frontoethmoidal recess as well as mild opacification over the ethmoid air cells and right maxillary sinus. Mastoid air cells are clear. Other: None. IMPRESSION: No acute intracranial findings. Mild chronic ischemic microvascular disease. Mild chronic sinus inflammatory change. Electronically Signed   By: Elberta Fortis M.D.   On: 05/22/2016 16:00   Ct Abdomen Pelvis W Contrast  Result Date: 05/23/2016 CLINICAL DATA:  The patient is lethargic with abdominal pain and loss of appetite. EXAM: CT ABDOMEN AND PELVIS WITH CONTRAST TECHNIQUE: Multidetector CT imaging of the abdomen and pelvis was performed using the standard protocol  following bolus administration of intravenous contrast. CONTRAST:  ISOVUE-300 IOPAMIDOL (ISOVUE-300) INJECTION 61% COMPARISON:  None. FINDINGS: Lower chest: The descending thoracic aorta measures up to 3.9 cm on series 2, image 1. The lung bases are otherwise unremarkable. Hepatobiliary: Cholelithiasis is identified in a mildly distended gallbladder. A probable tiny cyst is seen in the left hepatic lobe on axial image 11. This is too small to completely characterize. The liver and portal vein are otherwise normal. Pancreas: Unremarkable. No pancreatic ductal dilatation or surrounding inflammatory changes. Spleen: Normal in size without focal abnormality. Adrenals/Urinary Tract: There is a cyst in the right kidney. 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 The lower pole moiety is atrophic. An extrarenal pelvis associated with the lower pole moiety is mildly  prominent. No hydronephrosis. The distal right ureter is somewhat thickened as seen on coronal image 75. High attenuation foci just lateral to the right UVJ likely represent tiny previously passed stones. There is a small diverticulum associated with the lateral right bladder containing a high attenuation focus as seen on coronal image 70. The ureters and bladder are otherwise unremarkable. Stomach/Bowel: The stomach and small bowel are normal. There is a rounded defect along the left lateral wall of the rectum as seen on coronal image 81 and axial image 62 measuring 1.5 cm. Scattered colonic diverticuli are seen without diverticulitis. Remainder of the colon is unremarkable. The appendix is unremarkable as well. Vascular/Lymphatic: The proximal abdominal aorta measures 3.65 cm on series 2, image 8. There is atherosclerosis throughout the aorta. No dissection. The remainder of the aorta is normal in caliber. No adenopathy identified. Reproductive: Prostate is unremarkable. Other: No free air or free fluid. Evaluation of the upper to mid abdomen is limited by motion. Musculoskeletal: No acute or significant osseous findings. IMPRESSION: 1. Evaluation of the upper abdomen is limited due to respiratory motion. 2. 15 mm filling defect in the left lateral aspect of the rectum. This could represent adherent stool but a polyp or mass is not excluded. Recommend direct visualization. 3. The distal right ureter appears thickened, best seen on coronal images. This is an age-indeterminate finding of uncertain etiology. There are a few tiny stones in the bladder, just lateral to the UVJ which are consistent with previously passed stones of uncertain chronicity. There is a bladder diverticulum containing a tiny stone. 4. Multiple stones are seen in the mildly distended gallbladder with no wall thickening. An ultrasound could better evaluate the gallbladder. 5. The descending thoracic aorta measures 3.9 cm and the proximal  abdominal aortic measures 3.65 cm. Recommend followup by ultrasound in 2 years. This recommendation follows ACR consensus guidelines: White Paper of the ACR Incidental Findings Committee II on Vascular Findings. J Am Coll Radiol 2013; 10:789-794. Electronically Signed   By: Gerome Sam III M.D   On: 06/14/2016 16:14   Dg Chest Portable 1 View  Result Date: 06/08/2016 CLINICAL DATA:  Shortness of breath and altered mental status. EXAM: PORTABLE CHEST 1 VIEW COMPARISON:  09/21/2014 FINDINGS: Lungs are hyperexpanded without focal consolidation or effusion. Mild stable tenting of the left hemidiaphragm. Cardiac silhouette is within normal. Minimal calcification and tortuosity of the thoracic aorta. Remainder of the exam is unchanged. IMPRESSION: No acute cardiopulmonary disease. COPD. Aortic atherosclerosis. Electronically Signed   By: Elberta Fortis M.D.   On: 06/16/2016 14:02    Micro Results    Recent Results (from the past 240 hour(s))  MRSA PCR Screening     Status: None  Collection Time: 11-04-2016  6:00 PM  Result Value Ref Range Status   MRSA by PCR NEGATIVE NEGATIVE Final    Comment:        The GeneXpert MRSA Assay (FDA approved for NASAL specimens only), is one component of a comprehensive MRSA colonization surveillance program. It is not intended to diagnose MRSA infection nor to guide or monitor treatment for MRSA infections.        Today   Subjective    Oletta DarterSteve Gorder today has expired         Objective  Unresponsive, no spontaneous pulse or respiration or blood pressure   Intake/Output Summary (Last 24 hours) at 06/04/2016 1216 Last data filed at 06/13/2016 0100  Gross per 24 hour  Intake            36.57 ml  Output               60 ml  Net           -23.43 ml    Exam  EOSPCT 2 Aug 31, 2016   BASOPCT 0 Aug 31, 2016    CMP: Lab Results  Component Value Date   NA 139 06/09/2016   K 4.7 06/09/2016   CL 99 (L) 06/09/2016   CO2 35 (H) 06/09/2016   BUN 16  06/09/2016   CREATININE 0.90 06/09/2016   PROT 7.5 Aug 31, 2016   ALBUMIN 4.3 Aug 31, 2016   BILITOT 0.7 Aug 31, 2016   ALKPHOS 118 Aug 31, 2016   AST 22 Aug 31, 2016   ALT 14 (L) Aug 31, 2016  .   Total Discharge time is about 33 minutes  Hanadi Stanly M.D on 06/11/2016 at 12:16 PM  [facility]   Office  [phone]  Voice Recognition Reubin [place]/Dragon dictation system was used to create this note, attempts have been made to correct errors. Please contact the author with questions and/or clarifications.

## 2016-06-20 DEATH — deceased

## 2017-12-28 IMAGING — CR DG CHEST 1V PORT
1 series · 2 of 2 positions shown · non-contrast
Comparison: 09/21/2014

CLINICAL DATA: Shortness of breath and altered mental status.

EXAM:
PORTABLE CHEST 1 VIEW

[Series 1: portable · 0.17mm/px · 2 of 2 slices shown]
[im 1/2]
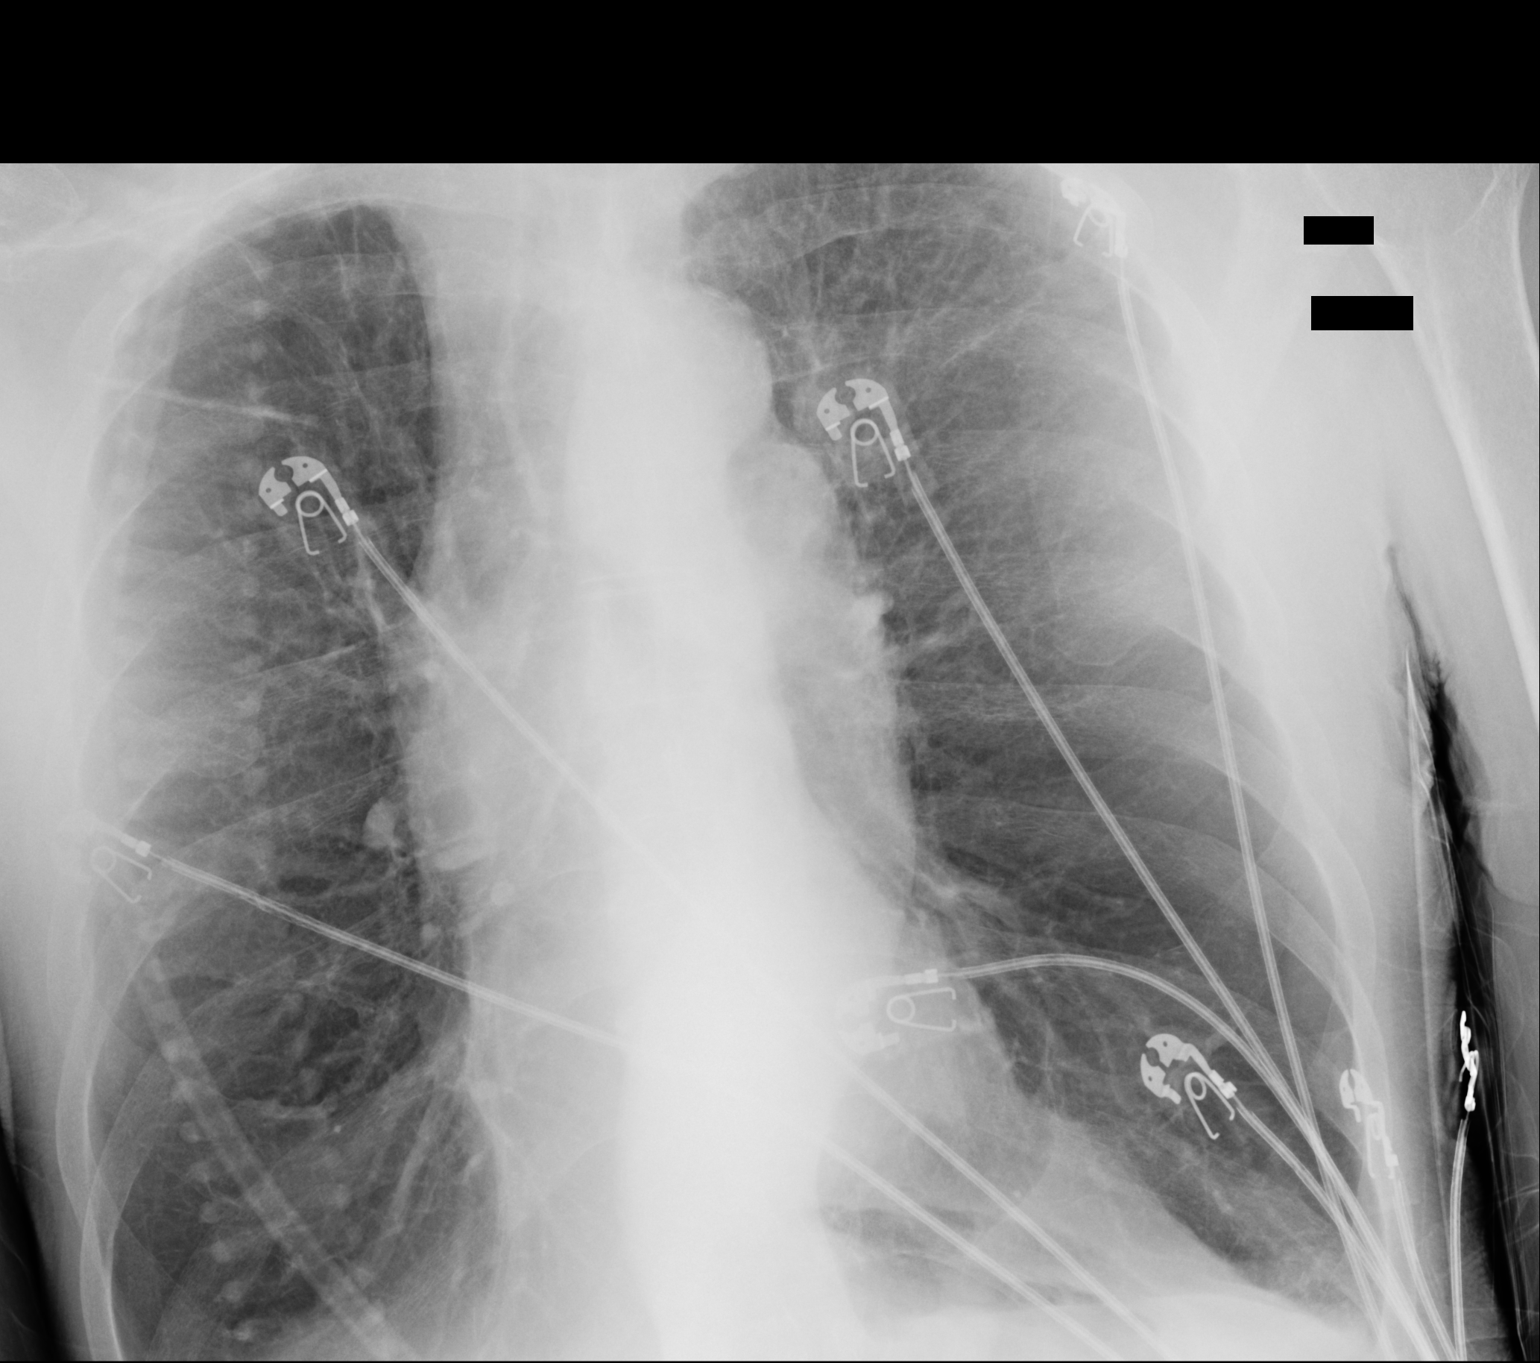
[im 2/2]
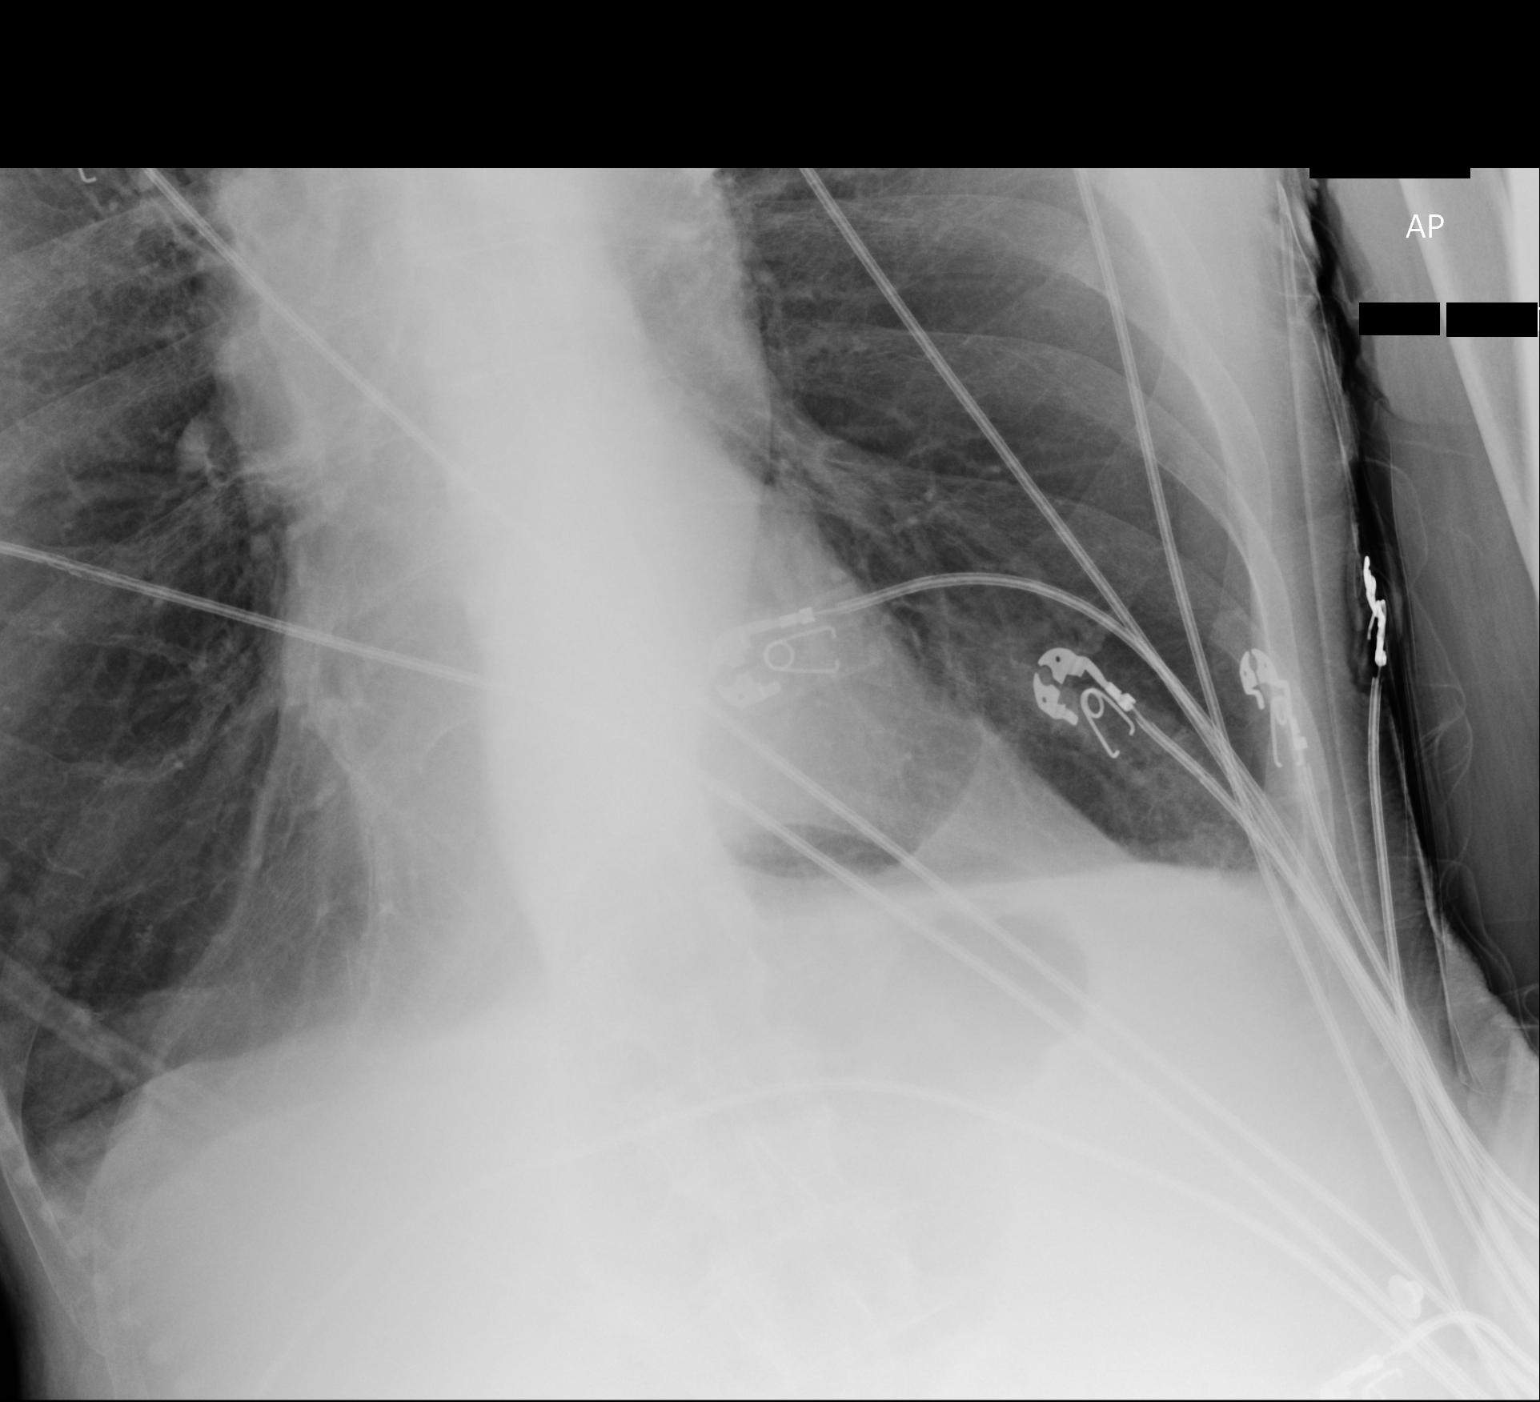

[2 of 2 positions shown; findings below may reference images not displayed]

FINDINGS: Lungs are hyperexpanded without focal consolidation or effusion.
Mild stable tenting of the left hemidiaphragm. Cardiac silhouette is
within normal. Minimal calcification and tortuosity of the thoracic
aorta. Remainder of the exam is unchanged.
IMPRESSION: No acute cardiopulmonary disease.

COPD.

Aortic atherosclerosis.

## 2017-12-28 IMAGING — CT CT ABD-PELV W/ CM
2 of 4 series · 15 of 46 positions shown, 17 images · IV contrast (Isovue)
Comparison: None.

CLINICAL DATA: The patient is lethargic with abdominal pain and
loss of appetite.

EXAM:
CT ABDOMEN AND PELVIS WITH CONTRAST
TECHNIQUE: Multidetector CT imaging of the abdomen and pelvis was performed
using the standard protocol following bolus administration of
intravenous contrast.
CONTRAST:  100mL 0STZSE-5EE IOPAMIDOL (0STZSE-5EE) INJECTION 61%

[Series 2: axial st · axial · 0.70mm/px · z∈[-692,-322]mm · 12 of 82 slices shown, 14 images]
[im 4/82  soft-tissue]
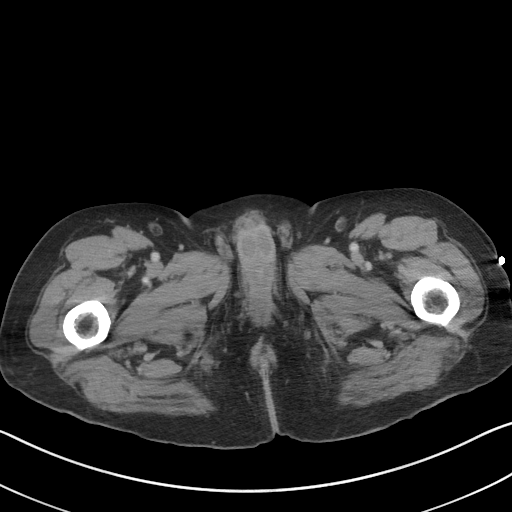
[im 4/82  bone]
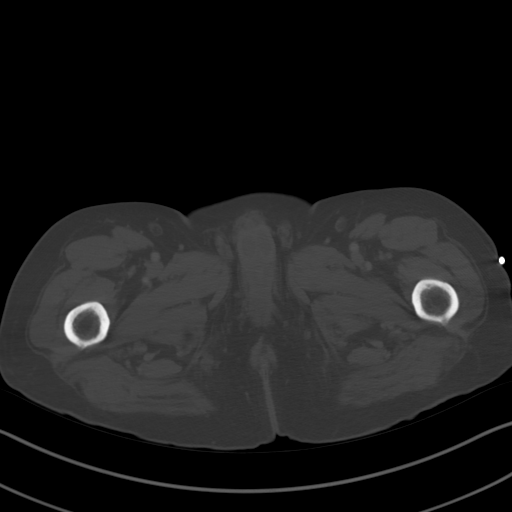
[im 12/82  soft-tissue]
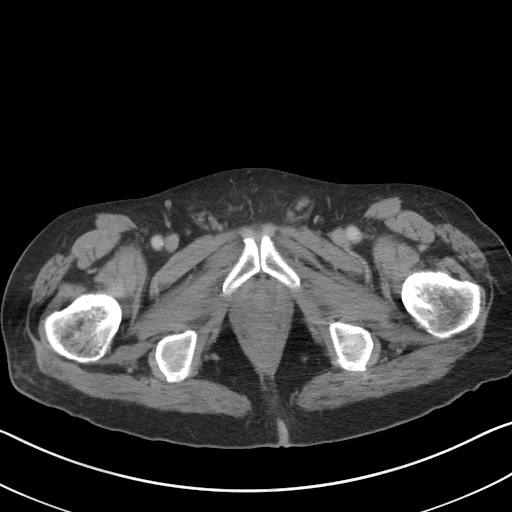
[im 19/82  soft-tissue]
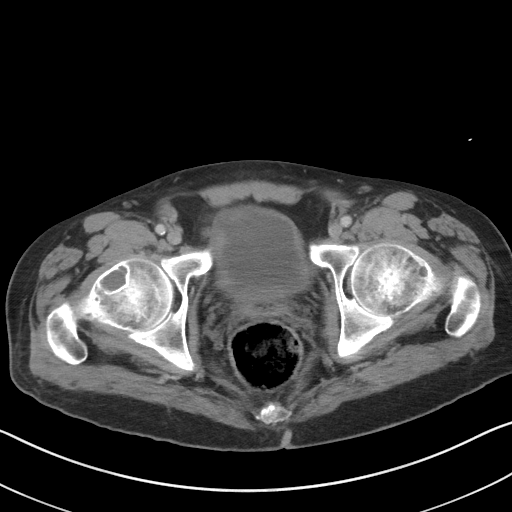
[im 26/82  soft-tissue]
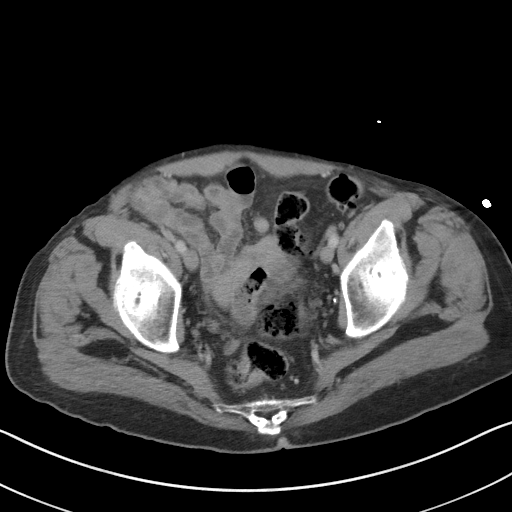
[im 30/82  soft-tissue]
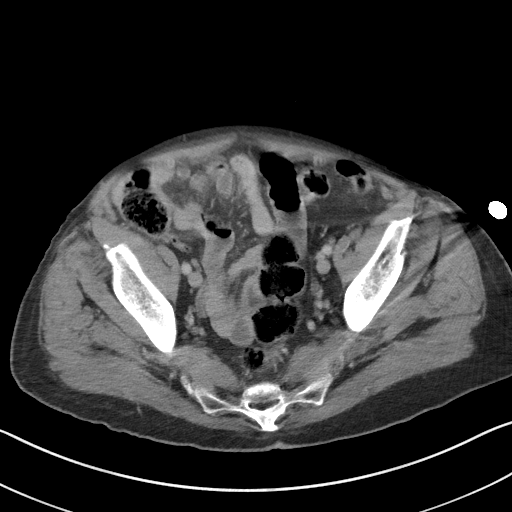
[im 37/82  soft-tissue]
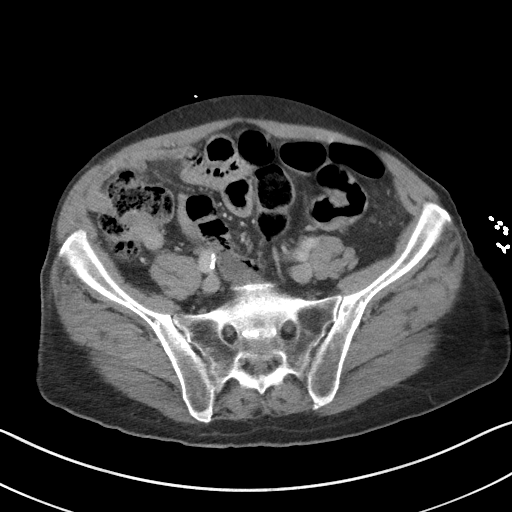
[im 45/82  soft-tissue]
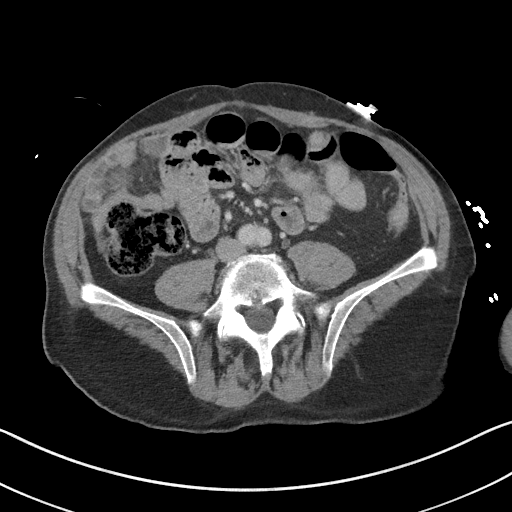
[im 52/82  soft-tissue]
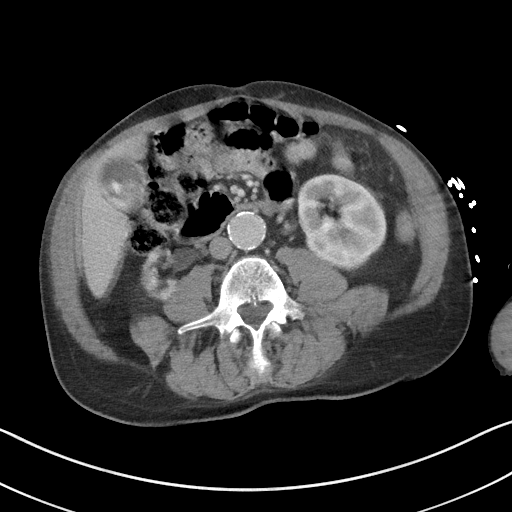
[im 56/82  soft-tissue]
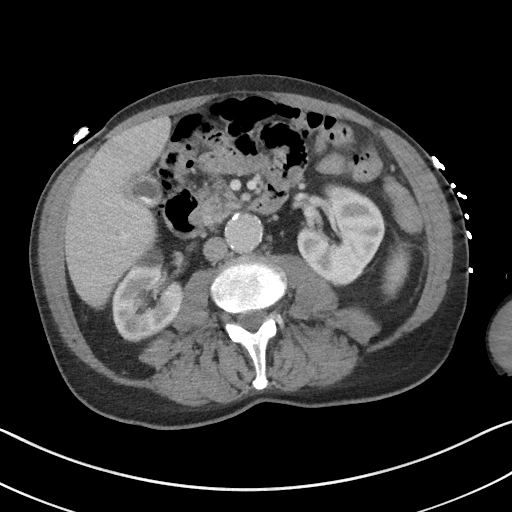
[im 56/82  bone]
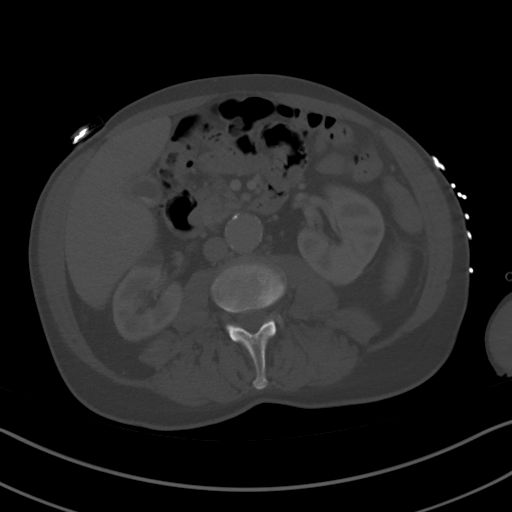
[im 63/82  soft-tissue]
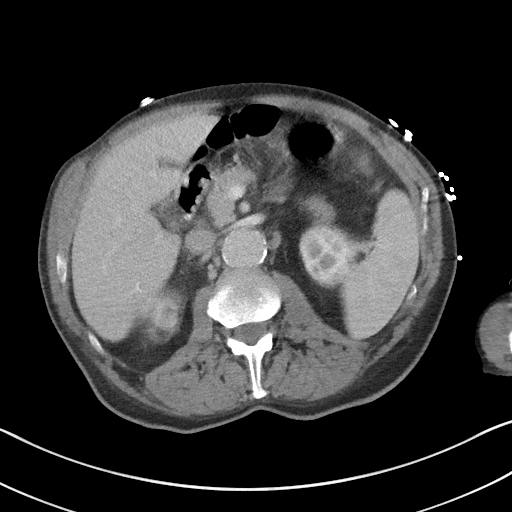
[im 70/82  soft-tissue]
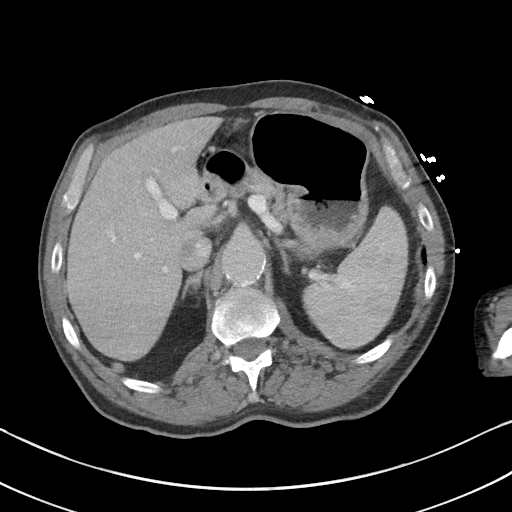
[im 78/82  soft-tissue]
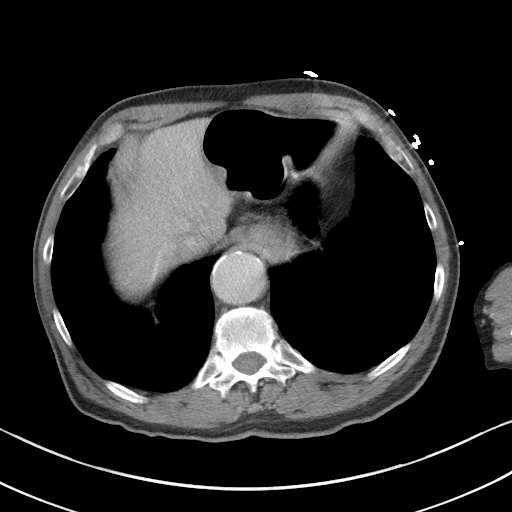

[Series 6: coronal st · coronal · 0.71mm/px · 3 of 114 slices shown]
[im 38/114  soft-tissue]
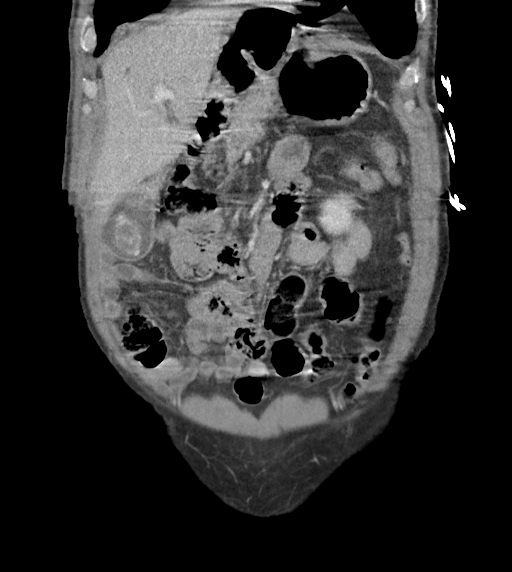
[im 51/114  soft-tissue]
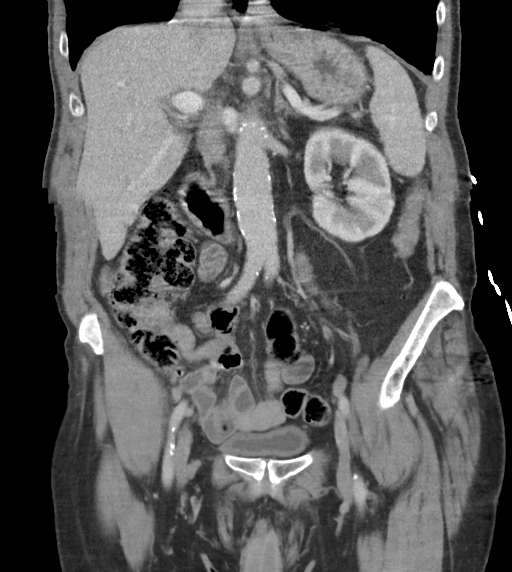
[im 63/114  soft-tissue]
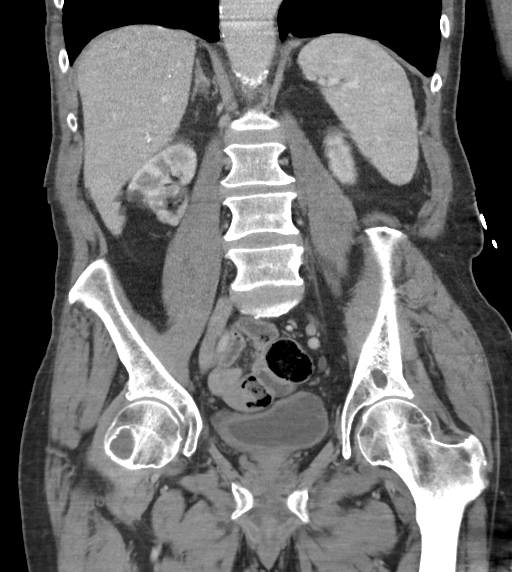

[15 of 46 positions shown; findings below may reference images not displayed]

FINDINGS: Lower chest: The descending thoracic aorta measures up to 3.9 cm on
series 2, image 1. The lung bases are otherwise unremarkable.

Hepatobiliary: Cholelithiasis is identified in a mildly distended
gallbladder. A probable tiny cyst is seen in the left hepatic lobe
on axial image 11. This is too small to completely characterize. The
liver and portal vein are otherwise normal.

Pancreas: Unremarkable. No pancreatic ductal dilatation or
surrounding inflammatory changes.

Spleen: Normal in size without focal abnormality.

Adrenals/Urinary Tract: There is a cyst in the right kidney. There
appears to be a duplicated collecting system on the right. The lower
pole moiety is atrophic. An extrarenal pelvis associated with the
lower pole moiety is mildly prominent. No hydronephrosis. The distal
right ureter is somewhat thickened as seen on coronal image 75. High
attenuation foci just lateral to the right UVJ likely represent tiny
previously passed stones. There is a small diverticulum associated
with the lateral right bladder containing a high attenuation focus
as seen on coronal image 70. The ureters and bladder are otherwise
unremarkable.

Stomach/Bowel: The stomach and small bowel are normal. There is a
rounded defect along the left lateral wall of the rectum as seen on
coronal image 81 and axial image 62 measuring 1.5 cm. Scattered
colonic diverticuli are seen without diverticulitis. Remainder of
the colon is unremarkable. The appendix is unremarkable as well.

Vascular/Lymphatic: The proximal abdominal aorta measures 3.65 cm on
series 2, image 8. There is atherosclerosis throughout the aorta. No
dissection. The remainder of the aorta is normal in caliber. No
adenopathy identified.

Reproductive: Prostate is unremarkable.

Other: No free air or free fluid. Evaluation of the upper to mid
abdomen is limited by motion.

Musculoskeletal: No acute or significant osseous findings.
IMPRESSION: 1. Evaluation of the upper abdomen is limited due to respiratory
motion.
2. 15 mm filling defect in the left lateral aspect of the rectum.
This could represent adherent stool but a polyp or mass is not
excluded. Recommend direct visualization.
3. The distal right ureter appears thickened, best seen on coronal
images. This is an age-indeterminate finding of uncertain etiology.
There are a few tiny stones in the bladder, just lateral to the UVJ
which are consistent with previously passed stones of uncertain
chronicity. There is a bladder diverticulum containing a tiny stone.
4. Multiple stones are seen in the mildly distended gallbladder with
no wall thickening. An ultrasound could better evaluate the
gallbladder.
5. The descending thoracic aorta measures 3.9 cm and the proximal
abdominal aortic measures 3.65 cm. Recommend followup by ultrasound
in 2 years. This recommendation follows ACR consensus guidelines:
White Paper of the ACR Incidental Findings Committee II on Vascular
Findings. [HOSPITAL] 7069; [DATE].
